# Patient Record
Sex: Female | Born: 1935 | Race: White | Hispanic: No | Smoking: Current every day smoker
Health system: Southern US, Community
[De-identification: ages and names within clinical notes are randomized; demographics above are authoritative.]

## PROBLEM LIST (undated history)

## (undated) DIAGNOSIS — I2510 Atherosclerotic heart disease of native coronary artery without angina pectoris: Secondary | ICD-10-CM

## (undated) DIAGNOSIS — Z923 Personal history of irradiation: Secondary | ICD-10-CM

## (undated) DIAGNOSIS — K219 Gastro-esophageal reflux disease without esophagitis: Secondary | ICD-10-CM

## (undated) DIAGNOSIS — I10 Essential (primary) hypertension: Secondary | ICD-10-CM

## (undated) DIAGNOSIS — Z9221 Personal history of antineoplastic chemotherapy: Secondary | ICD-10-CM

## (undated) DIAGNOSIS — G4730 Sleep apnea, unspecified: Secondary | ICD-10-CM

## (undated) DIAGNOSIS — C039 Malignant neoplasm of gum, unspecified: Secondary | ICD-10-CM

## (undated) DIAGNOSIS — E079 Disorder of thyroid, unspecified: Secondary | ICD-10-CM

## (undated) DIAGNOSIS — C3490 Malignant neoplasm of unspecified part of unspecified bronchus or lung: Secondary | ICD-10-CM

## (undated) DIAGNOSIS — J449 Chronic obstructive pulmonary disease, unspecified: Secondary | ICD-10-CM

## (undated) DIAGNOSIS — E785 Hyperlipidemia, unspecified: Secondary | ICD-10-CM

## (undated) DIAGNOSIS — R911 Solitary pulmonary nodule: Secondary | ICD-10-CM

## (undated) DIAGNOSIS — C411 Malignant neoplasm of mandible: Secondary | ICD-10-CM

## (undated) HISTORY — DX: Personal history of irradiation: Z92.3

## (undated) HISTORY — DX: Malignant neoplasm of unspecified part of unspecified bronchus or lung: C34.90

## (undated) HISTORY — DX: Atherosclerotic heart disease of native coronary artery without angina pectoris: I25.10

## (undated) HISTORY — DX: Gastro-esophageal reflux disease without esophagitis: K21.9

## (undated) HISTORY — DX: Personal history of antineoplastic chemotherapy: Z92.21

## (undated) HISTORY — PX: ABDOMINAL HYSTERECTOMY: SHX81

## (undated) HISTORY — DX: Malignant neoplasm of mandible: C41.1

## (undated) HISTORY — DX: Essential (primary) hypertension: I10

## (undated) HISTORY — DX: Disorder of thyroid, unspecified: E07.9

## (undated) HISTORY — PX: VAGINAL HYSTERECTOMY: SHX2639

## (undated) HISTORY — PX: LUNG BIOPSY: SHX232

## (undated) HISTORY — DX: Hyperlipidemia, unspecified: E78.5

## (undated) HISTORY — PX: LUNG SURGERY: SHX703

## (undated) HISTORY — DX: Solitary pulmonary nodule: R91.1

## (undated) HISTORY — PX: OTHER SURGICAL HISTORY: SHX169

## (undated) HISTORY — DX: Sleep apnea, unspecified: G47.30

## (undated) HISTORY — DX: Malignant neoplasm of gum, unspecified: C03.9

---

## 1993-08-24 DIAGNOSIS — C3490 Malignant neoplasm of unspecified part of unspecified bronchus or lung: Secondary | ICD-10-CM

## 1993-08-24 DIAGNOSIS — Z9221 Personal history of antineoplastic chemotherapy: Secondary | ICD-10-CM

## 1993-08-24 DIAGNOSIS — Z923 Personal history of irradiation: Secondary | ICD-10-CM

## 1993-08-24 HISTORY — DX: Malignant neoplasm of unspecified part of unspecified bronchus or lung: C34.90

## 1993-08-24 HISTORY — DX: Personal history of irradiation: Z92.3

## 1993-08-24 HISTORY — DX: Personal history of antineoplastic chemotherapy: Z92.21

## 2011-08-10 ENCOUNTER — Other Ambulatory Visit: Payer: Self-pay | Admitting: Family Medicine

## 2011-08-10 DIAGNOSIS — R221 Localized swelling, mass and lump, neck: Secondary | ICD-10-CM

## 2011-08-12 ENCOUNTER — Ambulatory Visit
Admission: RE | Admit: 2011-08-12 | Discharge: 2011-08-12 | Disposition: A | Discharge status: Home / Self Care | Payer: Medicare Other | Source: Ambulatory Visit | Attending: Family Medicine | Admitting: Family Medicine

## 2011-08-12 DIAGNOSIS — R221 Localized swelling, mass and lump, neck: Secondary | ICD-10-CM

## 2011-08-12 MED ORDER — IOHEXOL 300 MG/ML  SOLN
75.0000 mL | Freq: Once | INTRAMUSCULAR | Status: AC | PRN
  Administered 2011-08-12: 75 mL via INTRAVENOUS

## 2011-08-13 ENCOUNTER — Other Ambulatory Visit: Payer: Self-pay | Admitting: Family Medicine

## 2011-08-13 DIAGNOSIS — R911 Solitary pulmonary nodule: Secondary | ICD-10-CM

## 2011-08-20 ENCOUNTER — Encounter: Payer: Self-pay | Admitting: Internal Medicine

## 2011-08-20 ENCOUNTER — Ambulatory Visit (INDEPENDENT_AMBULATORY_CARE_PROVIDER_SITE_OTHER): Payer: Medicare Other | Admitting: Internal Medicine

## 2011-08-20 VITALS — BP 160/72 | HR 105 | Wt 188.8 lb

## 2011-08-20 DIAGNOSIS — R0600 Dyspnea, unspecified: Secondary | ICD-10-CM

## 2011-08-20 DIAGNOSIS — E785 Hyperlipidemia, unspecified: Secondary | ICD-10-CM

## 2011-08-20 DIAGNOSIS — I10 Essential (primary) hypertension: Secondary | ICD-10-CM

## 2011-08-20 DIAGNOSIS — F17200 Nicotine dependence, unspecified, uncomplicated: Secondary | ICD-10-CM

## 2011-08-20 DIAGNOSIS — Z720 Tobacco use: Secondary | ICD-10-CM

## 2011-08-20 DIAGNOSIS — I2510 Atherosclerotic heart disease of native coronary artery without angina pectoris: Secondary | ICD-10-CM

## 2011-08-20 DIAGNOSIS — R0602 Shortness of breath: Secondary | ICD-10-CM

## 2011-08-20 MED ORDER — DILTIAZEM HCL ER COATED BEADS 120 MG PO CP24: 120.0000 mg | ORAL_CAPSULE | Freq: Two times a day (BID) | ORAL | Status: DC

## 2011-08-20 NOTE — Patient Instructions (Addendum)
Lab work today. We will call you with results.  STOP Lisinopril  START Cardizem CD 120mg  2 times per day.  Your physician has requested that you have an echocardiogram. Echocardiography is a painless test that uses sound waves to create images of your heart. It provides your doctor with information about the size and shape of your heart and how well your heart's chambers and valves are working. This procedure takes approximately one hour. There are no restrictions for this procedure.  We will call you with your echo results.

## 2011-08-20 NOTE — Progress Notes (Signed)
HPI Patient is a 77 year old who is referred for Patient had a cardiac cath done in 2011 ([facility])  Had mild CAD.  Normal LVEF  1+ MR.   In past year breathig has gotten worse. Can't do anything.  At rest OK.    Hurting between shoulder blades.  Pain with coughing, vacuuming On 4th of December told had pneumonia L lung.  Rx Avelox 400 1x per day.  Has not returned.   Phlegm is white.   Had CT of neck Had mass at top of lung. BP checks from home earlier this month (140s to 160s systolic)  Allergies no known allergies  Current Outpatient Prescriptions  Medication Sig Dispense Refill  . aspirin 81 MG tablet Take 81 mg by mouth daily.        . cetirizine (ZYRTEC) 10 MG tablet Take 10 mg by mouth daily.        Marland Kitchen co-enzyme Q-10 30 MG capsule Take 100 mg by mouth daily.        Marland Kitchen levothyroxine (SYNTHROID, LEVOTHROID) 100 MCG tablet Take 100 mcg by mouth daily.        Marland Kitchen lisinopril (PRINIVIL,ZESTRIL) 10 MG tablet Take 10 mg by mouth daily.        Marland Kitchen LORazepam (ATIVAN) 1 MG tablet Take 1 mg by mouth every 8 (eight) hours.        . Melatonin 1 MG CAPS Take by mouth as needed.        . NON FORMULARY Take 10 mg by mouth daily as needed. bc powder       . omeprazole (PRILOSEC) 40 MG capsule Take 40 mg by mouth daily.        . rosuvastatin (CRESTOR) 40 MG tablet Take 40 mg by mouth daily.        . vitamin B-12 (CYANOCOBALAMIN) 1000 MCG tablet Take 1,000 mcg by mouth daily.          No past medical history on file.  No past surgical history on file.  No family history on file.  History   Social History  . Marital Status: Widowed    Spouse Name: N/A    Number of Children: N/A  . Years of Education: N/A   Occupational History  . Not on file.   Social History Main Topics  . Smoking status: Current Everyday Smoker  . Smokeless tobacco: Not on file  . Alcohol Use: Not on file  . Drug Use: Not on file  . Sexually Active: Not on file   Other Topics Concern  . Not on file   Social History  Narrative  . No narrative on file    Review of Systems:  All systems reviewed.  They are negative to the above problem except as previously stated.  Vital Signs: BP 160/72  Pulse 105  Wt 188 lb 12.8 oz (85.639 kg)  PF 5 L/min  Physical Exam  Patient is in NAD  Sats stay at 96% with walking   HEENT:  Normocephalic, atraumatic. EOMI, PERRLA.  Neck: JVP is normal. . No bruits.  Lungs: Decreased air flow.  No wheezes or rales.  Heart: Regular rate and rhythm. Normal S1, S2. No S3.   No significant murmurs. PMI not displaced.  Abdomen:  Supple, nontender. Normal bowel sounds. No masses. No hepatomegaly.  Extremities:   Good distal pulses throughout. No lower extremity edema.  Musculoskeletal :moving all extremities.  Neuro:   alert and oriented x3.  CN II-XII grossly intact.  Assessment and Plan:

## 2011-08-21 ENCOUNTER — Ambulatory Visit
Admission: RE | Admit: 2011-08-21 | Discharge: 2011-08-21 | Disposition: A | Discharge status: Home / Self Care | Payer: Medicare Other | Source: Ambulatory Visit | Attending: Family Medicine | Admitting: Family Medicine

## 2011-08-21 ENCOUNTER — Telehealth: Payer: Self-pay | Admitting: Internal Medicine

## 2011-08-21 DIAGNOSIS — R911 Solitary pulmonary nodule: Secondary | ICD-10-CM

## 2011-08-21 LAB — CBC WITH DIFFERENTIAL/PLATELET
Basophils Absolute: 0.1 10*3/uL (ref 0.0–0.1)
Basophils Relative: 0.7 % (ref 0.0–3.0)
Eosinophils Absolute: 0.5 10*3/uL (ref 0.0–0.7)
Hemoglobin: 12.7 g/dL (ref 12.0–15.0)
Lymphocytes Relative: 26.9 % (ref 12.0–46.0)
MCHC: 33.3 g/dL (ref 30.0–36.0)
Monocytes Relative: 7 % (ref 3.0–12.0)
Neutrophils Relative %: 59 % (ref 43.0–77.0)
RBC: 4.13 Mil/uL (ref 3.87–5.11)

## 2011-08-21 LAB — BASIC METABOLIC PANEL
Chloride: 101 mEq/L (ref 96–112)
GFR: 103.49 mL/min (ref >60.00)
Glucose, Bld: 94 mg/dL (ref 70–99)
Potassium: 4.5 mEq/L (ref 3.5–5.1)
Sodium: 140 mEq/L (ref 135–145)

## 2011-08-21 MED ORDER — IOHEXOL 300 MG/ML  SOLN
75.0000 mL | Freq: Once | INTRAMUSCULAR | Status: AC | PRN
  Administered 2011-08-21: 75 mL via INTRAVENOUS

## 2011-08-21 NOTE — Telephone Encounter (Signed)
Pt Signed Release To obtain records from [facility], Records Received gave to [facility] 08/21/11/KM.Marland Kitchen

## 2011-08-23 ENCOUNTER — Encounter: Payer: Self-pay | Admitting: Internal Medicine

## 2011-08-23 DIAGNOSIS — I10 Essential (primary) hypertension: Secondary | ICD-10-CM | POA: Insufficient documentation

## 2011-08-23 DIAGNOSIS — E785 Hyperlipidemia, unspecified: Secondary | ICD-10-CM | POA: Insufficient documentation

## 2011-08-23 DIAGNOSIS — I2510 Atherosclerotic heart disease of native coronary artery without angina pectoris: Secondary | ICD-10-CM | POA: Insufficient documentation

## 2011-08-23 DIAGNOSIS — Z720 Tobacco use: Secondary | ICD-10-CM | POA: Insufficient documentation

## 2011-08-23 DIAGNOSIS — J439 Emphysema, unspecified: Secondary | ICD-10-CM | POA: Insufficient documentation

## 2011-08-23 NOTE — Assessment & Plan Note (Signed)
On statin  Wil need to be followed.

## 2011-08-23 NOTE — Assessment & Plan Note (Signed)
BP is not optimally controlled.  I will not make any changes for now.  Will check labs and echo first

## 2011-08-23 NOTE — Assessment & Plan Note (Signed)
Counsellled on cessation (patch, chantix)

## 2011-08-23 NOTE — Assessment & Plan Note (Signed)
Patient with signif COPD on exam.  Still smokes  Being evaluated for lung lesion.  Hx lung Ca.  She continues to smoke. ON exam volume status does not look bad I would check labs and also a 2D echo.  I am not convinced of active angina

## 2011-08-23 NOTE — Assessment & Plan Note (Signed)
Mild by cath.  Will check echo.

## 2011-08-28 ENCOUNTER — Ambulatory Visit (HOSPITAL_COMMUNITY): Payer: Medicare Other | Attending: Cardiology | Admitting: Radiology

## 2011-08-28 DIAGNOSIS — R0609 Other forms of dyspnea: Secondary | ICD-10-CM | POA: Insufficient documentation

## 2011-08-28 DIAGNOSIS — I10 Essential (primary) hypertension: Secondary | ICD-10-CM | POA: Insufficient documentation

## 2011-08-28 DIAGNOSIS — J449 Chronic obstructive pulmonary disease, unspecified: Secondary | ICD-10-CM | POA: Insufficient documentation

## 2011-08-28 DIAGNOSIS — E785 Hyperlipidemia, unspecified: Secondary | ICD-10-CM | POA: Insufficient documentation

## 2011-08-28 DIAGNOSIS — R0602 Shortness of breath: Secondary | ICD-10-CM

## 2011-08-28 DIAGNOSIS — R0989 Other specified symptoms and signs involving the circulatory and respiratory systems: Secondary | ICD-10-CM | POA: Insufficient documentation

## 2011-08-28 DIAGNOSIS — J4489 Other specified chronic obstructive pulmonary disease: Secondary | ICD-10-CM | POA: Insufficient documentation

## 2011-08-31 ENCOUNTER — Telehealth: Payer: Self-pay | Admitting: *Deleted

## 2011-08-31 NOTE — Telephone Encounter (Signed)
Called patient with lab results and echo results. She will follow up with Dr.Fred Andrey Campanile. Reports sent to him.

## 2011-09-10 DIAGNOSIS — R911 Solitary pulmonary nodule: Secondary | ICD-10-CM

## 2011-09-11 ENCOUNTER — Encounter: Payer: Self-pay | Admitting: *Deleted

## 2011-09-11 NOTE — Progress Notes (Signed)
Spoke with [facility] office regarding pt appt with [facility] 09/15/11

## 2011-09-11 NOTE — Progress Notes (Signed)
Addended by: Judithe Modest D on: 09/11/2011 03:24 PM   Modules accepted: Orders

## 2011-09-14 DIAGNOSIS — R911 Solitary pulmonary nodule: Secondary | ICD-10-CM | POA: Insufficient documentation

## 2011-09-15 ENCOUNTER — Encounter: Payer: Medicare Other | Admitting: Cardiothoracic Surgery

## 2011-09-18 ENCOUNTER — Encounter: Payer: Medicare Other | Admitting: Cardiothoracic Surgery

## 2011-09-22 DIAGNOSIS — C801 Malignant (primary) neoplasm, unspecified: Secondary | ICD-10-CM | POA: Insufficient documentation

## 2011-09-23 ENCOUNTER — Encounter: Payer: Self-pay | Admitting: Cardiothoracic Surgery

## 2011-09-23 ENCOUNTER — Institutional Professional Consult (permissible substitution) (INDEPENDENT_AMBULATORY_CARE_PROVIDER_SITE_OTHER): Payer: Medicare Other | Admitting: Cardiothoracic Surgery

## 2011-09-23 ENCOUNTER — Other Ambulatory Visit: Payer: Self-pay | Admitting: Cardiothoracic Surgery

## 2011-09-23 ENCOUNTER — Encounter: Payer: Self-pay | Admitting: *Deleted

## 2011-09-23 VITALS — BP 140/75 | HR 100 | Resp 20 | Ht 69.0 in | Wt 190.0 lb

## 2011-09-23 DIAGNOSIS — G4733 Obstructive sleep apnea (adult) (pediatric): Secondary | ICD-10-CM | POA: Insufficient documentation

## 2011-09-23 DIAGNOSIS — D381 Neoplasm of uncertain behavior of trachea, bronchus and lung: Secondary | ICD-10-CM

## 2011-09-23 DIAGNOSIS — R911 Solitary pulmonary nodule: Secondary | ICD-10-CM

## 2011-09-23 DIAGNOSIS — J984 Other disorders of lung: Secondary | ICD-10-CM
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
## 2011-09-23 NOTE — Patient Instructions (Signed)
Stop smoking

## 2011-09-29 ENCOUNTER — Encounter (HOSPITAL_COMMUNITY)
Admission: RE | Admit: 2011-09-29 | Discharge: 2011-09-29 | Disposition: A | Discharge status: Home / Self Care | Payer: Medicare Other | Source: Ambulatory Visit | Attending: Cardiothoracic Surgery | Admitting: Cardiothoracic Surgery

## 2011-09-29 DIAGNOSIS — D381 Neoplasm of uncertain behavior of trachea, bronchus and lung: Secondary | ICD-10-CM

## 2011-09-29 DIAGNOSIS — R928 Other abnormal and inconclusive findings on diagnostic imaging of breast: Secondary | ICD-10-CM | POA: Insufficient documentation

## 2011-09-29 DIAGNOSIS — Z90710 Acquired absence of both cervix and uterus: Secondary | ICD-10-CM | POA: Insufficient documentation

## 2011-09-29 DIAGNOSIS — M899 Disorder of bone, unspecified: Secondary | ICD-10-CM | POA: Insufficient documentation

## 2011-09-29 DIAGNOSIS — R222 Localized swelling, mass and lump, trunk: Secondary | ICD-10-CM | POA: Insufficient documentation

## 2011-09-29 DIAGNOSIS — Z902 Acquired absence of lung [part of]: Secondary | ICD-10-CM | POA: Insufficient documentation

## 2011-09-29 DIAGNOSIS — I700 Atherosclerosis of aorta: Secondary | ICD-10-CM | POA: Insufficient documentation

## 2011-09-29 LAB — GLUCOSE, CAPILLARY: Glucose-Capillary: 135 mg/dL — ABNORMAL HIGH (ref 70–99)

## 2011-09-29 MED ORDER — FLUDEOXYGLUCOSE F - 18 (FDG) INJECTION
16.9000 | Freq: Once | INTRAVENOUS | Status: AC | PRN
  Administered 2011-09-29: 16.9 via INTRAVENOUS

## 2011-09-30 ENCOUNTER — Ambulatory Visit (INDEPENDENT_AMBULATORY_CARE_PROVIDER_SITE_OTHER): Payer: Medicare Other | Admitting: Cardiothoracic Surgery

## 2011-09-30 ENCOUNTER — Encounter: Payer: Self-pay | Admitting: Cardiothoracic Surgery

## 2011-09-30 VITALS — BP 130/71 | HR 104 | Resp 20 | Ht 70.0 in | Wt 190.0 lb

## 2011-09-30 DIAGNOSIS — J449 Chronic obstructive pulmonary disease, unspecified: Secondary | ICD-10-CM

## 2011-09-30 DIAGNOSIS — Z902 Acquired absence of lung [part of]: Secondary | ICD-10-CM

## 2011-09-30 DIAGNOSIS — R911 Solitary pulmonary nodule: Secondary | ICD-10-CM

## 2011-09-30 DIAGNOSIS — Z9889 Other specified postprocedural states: Secondary | ICD-10-CM

## 2011-09-30 NOTE — Patient Instructions (Signed)
Stop smoking

## 2011-09-30 NOTE — Progress Notes (Signed)
Patient ID: Alexandria Warren, female   DOB: [birth year], 78 y.o.   MRN: [id number] Spirometry in th e office indicates FEV1` 1.0 and FVC 1.9

## 2011-09-30 NOTE — Progress Notes (Signed)
HPI:                     [address]            [place] [address]          [phone]    1.2 cm nodule severe segment left lower lobe with negative activity and PET scan Status post right upper lobectomy for stage III non-small cell carcinoma 1992 m to [facility] COPD, severe with active smoking  The patient returns to discuss results of a recent PET scan to assess the hypermetabolic potential of a newly diagnosed nodule in the severe segment left lower lobe. The patient recently resolved a left lung bronchopneumonia after plum course of antibiotics. The nodule in question is somewhat smooth in contour. The patient has old significant scarring in the right lung apex following surgery and radiation therapy in 1992. The PET scan shows no enhanced activity of the nodule. There is some mild activity in the right mediastinal nodal tissue from radiation changes. There is some mild activity in the eschar of the right upper lobe area none of which suggest high suspicion for recurrent cancer. While the patient does have risk of recurrent cancer due to the continued smoking I would not recommend mediastinoscopy or transbronchial biopsy under general anesthesia the to her severe COPD and potential for significant overtop the patient's. I would recommend rather a followup CT scan in 3-4 months to assess the left lung nodule.    Current Outpatient Prescriptions  Medication Sig Dispense Refill  . aspirin 81 MG tablet Take 81 mg by mouth daily.        . cetirizine (ZYRTEC) 10 MG tablet Take 10 mg by mouth daily.        Marland Kitchen co-enzyme Q-10 30 MG capsule Take 100 mg by mouth daily.        Marland Kitchen diltiazem (CARDIZEM CD) 120 MG 24 hr capsule Take 1 capsule (120 mg total) by mouth 2 (two) times daily.  60 capsule  6  . levothyroxine (SYNTHROID, LEVOTHROID) 100 MCG tablet Take 100 mcg by mouth daily.        Marland Kitchen LORazepam (ATIVAN) 1 MG tablet Take 1 mg by mouth every 8 (eight) hours.        .  Melatonin 1 MG CAPS Take by mouth as needed.        . NON FORMULARY Take 10 mg by mouth daily as needed. bc powder       . omeprazole (PRILOSEC) 40 MG capsule Take 40 mg by mouth daily.        . rosuvastatin (CRESTOR) 40 MG tablet Take 40 mg by mouth daily.        . vitamin B-12 (CYANOCOBALAMIN) 1000 MCG tablet Take 1,000 mcg by mouth daily.           Physical Exam: Vital signs blood pressure 140/70 pulse 100 saturation room air 95% Breath sounds distant bilaterally No palpable adenopathy the neck No edema  Diagnostic Tests: PET scan results reviewed with the patient and her husband. The newly diagnosed left lower lung nodule has no significant hypermetabolic activity. The previous area of her surgery and radiation and the right apex and right mediastinal have some residual low-level activity reactive to the previous surgery and radiation.  Impression: No evidence of new or recurrent lung cancer. We'll plan on a followup CT scan in 3-4 months. He is strong recommend patient stopped smoking.  Plan: Return in  May 2013 with a CT scan of the chest

## 2011-10-01 ENCOUNTER — Other Ambulatory Visit: Payer: Self-pay | Admitting: *Deleted

## 2011-10-01 DIAGNOSIS — J449 Chronic obstructive pulmonary disease, unspecified: Secondary | ICD-10-CM

## 2011-10-01 MED ORDER — IPRATROPIUM-ALBUTEROL 18-103 MCG/ACT IN AERO: 2.0000 | INHALATION_SPRAY | Freq: Four times a day (QID) | RESPIRATORY_TRACT | Status: DC | PRN

## 2011-10-02 ENCOUNTER — Institutional Professional Consult (permissible substitution): Payer: Medicare Other | Admitting: Internal Medicine

## 2011-12-03 ENCOUNTER — Other Ambulatory Visit: Payer: Self-pay | Admitting: Cardiothoracic Surgery

## 2011-12-03 DIAGNOSIS — D381 Neoplasm of uncertain behavior of trachea, bronchus and lung: Secondary | ICD-10-CM

## 2012-01-27 ENCOUNTER — Encounter: Payer: Medicare Other | Admitting: Cardiothoracic Surgery

## 2012-01-27 ENCOUNTER — Other Ambulatory Visit: Payer: Medicare Other

## 2012-01-29 ENCOUNTER — Ambulatory Visit
Admission: RE | Admit: 2012-01-29 | Discharge: 2012-01-29 | Disposition: A | Discharge status: Home / Self Care | Payer: Medicare Other | Source: Ambulatory Visit | Attending: Cardiothoracic Surgery | Admitting: Cardiothoracic Surgery

## 2012-01-29 ENCOUNTER — Ambulatory Visit (INDEPENDENT_AMBULATORY_CARE_PROVIDER_SITE_OTHER): Payer: Medicare Other | Admitting: Cardiothoracic Surgery

## 2012-01-29 ENCOUNTER — Encounter: Payer: Self-pay | Admitting: Cardiothoracic Surgery

## 2012-01-29 VITALS — BP 141/82 | HR 100 | Resp 20 | Ht 70.0 in | Wt 198.0 lb

## 2012-01-29 DIAGNOSIS — D381 Neoplasm of uncertain behavior of trachea, bronchus and lung: Secondary | ICD-10-CM

## 2012-01-29 DIAGNOSIS — R911 Solitary pulmonary nodule: Secondary | ICD-10-CM

## 2012-01-29 DIAGNOSIS — J449 Chronic obstructive pulmonary disease, unspecified: Secondary | ICD-10-CM

## 2012-01-29 DIAGNOSIS — Z85118 Personal history of other malignant neoplasm of bronchus and lung: Secondary | ICD-10-CM

## 2012-01-29 DIAGNOSIS — J4489 Other specified chronic obstructive pulmonary disease: Secondary | ICD-10-CM

## 2012-01-29 MED ORDER — IOHEXOL 300 MG/ML  SOLN
75.0000 mL | Freq: Once | INTRAMUSCULAR | Status: AC | PRN
  Administered 2012-01-29: 75 mL via INTRAVENOUS

## 2012-01-29 NOTE — Progress Notes (Signed)
PCP is [name], MD, MD Referring Provider is Richmond Campbell., PA  Chief Complaint  Patient presents with  . Lung Lesion    3 month f/u with Chest CT, surveillance of Lt lung nodule                      [address]            [name] [address]          [phone]      HPI: 78 year old Caucasian female smoker one half to one pack per day returns for 3 month followup CT scan of a recently diagnosed 8 mm superior segment left lower lobe nodule negative for metabolic activity on PET scan. Past history of right upper lobectomy followed by chemoradiation at [facility] 20 years ago. Some right paratracheal adenopathy with low level activity on PET scan consistent with previous radiation. CT scan today shows no change in the 8 mm left lower lobe nodule, no change in the right paratracheal adenopathy or the postoperative changes in the right lung. Recommend followup CT scan of the chest in 6 months and stopped smoking cessation. Past Medical History  Diagnosis Date  . Thyroid disease   . Hypertension   . Coronary artery disease   . Hyperlipidemia   . Lung nodule   . Cancer     lung  . Sleep apnea   . GERD (gastroesophageal reflux disease)   . Hyperglycemia   . Jaw sarcoma     Past Surgical History  Procedure Date  . Vaginal hysterectomy   . Lung surgery     Family History  Problem Relation Age of Onset  . Hypertension    . Cancer    . Allergic rhinitis    . Heart disease      Social History History  Substance Use Topics  . Smoking status: Current Everyday Smoker  . Smokeless tobacco: Not on file  . Alcohol Use: Not on file    Current Outpatient Prescriptions  Medication Sig Dispense Refill  . albuterol (PROVENTIL HFA;VENTOLIN HFA) 108 (90 BASE) MCG/ACT inhaler Inhale 2 puffs into the lungs every 6 (six) hours as needed.      Marland Kitchen albuterol-ipratropium (COMBIVENT) 18-103 MCG/ACT inhaler Inhale 2 puffs into the lungs 4 (four) times  daily as needed for wheezing.  1 Inhaler  2  . aspirin 81 MG tablet Take 81 mg by mouth daily.        . budesonide-formoterol (SYMBICORT) 160-4.5 MCG/ACT inhaler Inhale 2 puffs into the lungs 2 (two) times daily.      . cetirizine (ZYRTEC) 10 MG tablet Take 10 mg by mouth daily.        Marland Kitchen co-enzyme Q-10 30 MG capsule Take 100 mg by mouth daily.        Marland Kitchen levothyroxine (SYNTHROID, LEVOTHROID) 100 MCG tablet Take 100 mcg by mouth daily.        Marland Kitchen LORazepam (ATIVAN) 1 MG tablet Take 1 mg by mouth every 8 (eight) hours.        . Melatonin 1 MG CAPS Take by mouth as needed.        . NON FORMULARY Take 10 mg by mouth daily as needed. bc powder       . omeprazole (PRILOSEC) 40 MG capsule Take 40 mg by mouth daily.        . rosuvastatin (CRESTOR) 40 MG tablet Take 40 mg by mouth daily.        Marland Kitchen  vitamin B-12 (CYANOCOBALAMIN) 1000 MCG tablet Take 1,000 mcg by mouth daily.         No current facility-administered medications for this visit.   Facility-Administered Medications Ordered in Other Visits  Medication Dose Route Frequency Provider Last Rate Last Dose  . iohexol (OMNIPAQUE) 300 MG/ML solution 75 mL  75 mL Intravenous Once PRN Medication Radiologist, MD   75 mL at 01/29/12 0928    Allergies  Allergen Reactions  . Penicillins     Review of Systems patient denies weight loss bone pain change in neurologic status. Patient complains of bilateral ankle swelling treated with Lasix by her primary physician Dr. Arlyce Dice. Patient states her wheezing is much better on Symbicort. Patient denies any recent episodes of productive cough or upper respiratory infection.  BP 141/82  Pulse 100  Resp 20  Ht 5\' 10"  (1.778 m)  Wt 198 lb (89.812 kg)  BMI 28.41 kg/m2  SpO2 90% Physical Exam Alert and pleasant but dyspneic with normal conversation HEENT normocephalic Neck without palpable adenopathy Breath sounds somewhat diminished on the right clear on the left Cardiac rhythm regular 1+ ankle  edema  Diagnostic Tests: The scan of the chest is reviewed. The 8 mm nodule the superior segment left lower lobe has shown no changes since previous study February 2013.  Impression: Non PET avid nodule left lower lobe stable the past 3 months. Followup CT scan planned in 6 months.  Plan: Smoking cessation recommended the patient. Nodule remained small and without activity on PET scan I would not recommend a biopsy or excision due to the patient's very poor pulmonary reserve. We'll review with CT scan in 6 months

## 2012-01-29 NOTE — Patient Instructions (Signed)
Stop smoking

## 2012-03-15 ENCOUNTER — Other Ambulatory Visit: Payer: Self-pay

## 2012-03-15 MED ORDER — DILTIAZEM HCL ER COATED BEADS 120 MG PO CP24: 120.0000 mg | ORAL_CAPSULE | Freq: Every day | ORAL | Status: DC

## 2012-06-17 ENCOUNTER — Encounter: Payer: Self-pay | Admitting: Cardiology

## 2012-06-17 ENCOUNTER — Ambulatory Visit (INDEPENDENT_AMBULATORY_CARE_PROVIDER_SITE_OTHER): Payer: Medicare Other | Admitting: Cardiology

## 2012-06-17 VITALS — BP 135/71 | HR 87 | Ht 70.0 in | Wt 193.4 lb

## 2012-06-17 DIAGNOSIS — Z720 Tobacco use: Secondary | ICD-10-CM

## 2012-06-17 DIAGNOSIS — I2510 Atherosclerotic heart disease of native coronary artery without angina pectoris: Secondary | ICD-10-CM

## 2012-06-17 DIAGNOSIS — R0600 Dyspnea, unspecified: Secondary | ICD-10-CM

## 2012-06-17 DIAGNOSIS — F17200 Nicotine dependence, unspecified, uncomplicated: Secondary | ICD-10-CM

## 2012-06-17 DIAGNOSIS — I10 Essential (primary) hypertension: Secondary | ICD-10-CM

## 2012-06-17 DIAGNOSIS — E785 Hyperlipidemia, unspecified: Secondary | ICD-10-CM

## 2012-06-17 NOTE — Progress Notes (Signed)
HPI The patient presents for evaluation of chest discomfort. She has been seen before and I do note the results of the catheterization in 2011 that demonstrated 20% LAD stenosis and 30% right coronary stenosis with a well preserved ejection fraction. She had dyspnea and evaluated earlier this year with an echocardiogram that demonstrated an EF of 55% with no significant valvular abnormalities as lipomatous hypertrophy.  She is referred back for an episode of chest discomfort. This happened about one week ago while she was seated. She was resting comfortably and developed substernal heaviness that she had never had before. It was moderate to severe in intensity. It lasted at least 3 minutes. She was diaphoretic. It came and went spontaneously. There was no radiation to her jaw or to her arms. She is chronically dyspneic and didn't have any acute exacerbation. She chronically is with her head reclined in a hospital bed but does not describe any new PND or orthopnea. Her heart goes fast with any activities but she's not had any presyncope or syncope. She has not had these symptoms before or since.  Allergies  Allergen Reactions  . Penicillins     Current Outpatient Prescriptions  Medication Sig Dispense Refill  . aspirin 81 MG tablet Take 81 mg by mouth daily.        Marland Kitchen atenolol (TENORMIN) 25 MG tablet       . budesonide-formoterol (SYMBICORT) 160-4.5 MCG/ACT inhaler Inhale 2 puffs into the lungs 2 (two) times daily.      . cetirizine (ZYRTEC) 10 MG tablet Take 10 mg by mouth daily. As needed      . co-enzyme Q-10 30 MG capsule Take 100 mg by mouth daily.        . furosemide (LASIX) 20 MG tablet       . levothyroxine (SYNTHROID, LEVOTHROID) 100 MCG tablet Take 100 mcg by mouth daily.        Marland Kitchen lisinopril (PRINIVIL,ZESTRIL) 10 MG tablet       . LORazepam (ATIVAN) 1 MG tablet Take 1 mg by mouth every 8 (eight) hours.        . Melatonin 1 MG CAPS Take by mouth as needed.        . Multiple Vitamin  (MULTIVITAMIN) tablet Take 1 tablet by mouth daily.      Marland Kitchen omeprazole (PRILOSEC) 40 MG capsule Take 40 mg by mouth daily.        . rosuvastatin (CRESTOR) 40 MG tablet Take 40 mg by mouth daily.        . NON FORMULARY Take 10 mg by mouth daily as needed. bc powder       . vitamin B-12 (CYANOCOBALAMIN) 1000 MCG tablet Take 1,000 mcg by mouth daily.          Past Medical History  Diagnosis Date  . Thyroid disease   . Hypertension   . Coronary artery disease   . Hyperlipidemia   . Lung nodule   . Cancer     lung  . Sleep apnea   . GERD (gastroesophageal reflux disease)   . Hyperglycemia   . Jaw sarcoma     Past Surgical History  Procedure Date  . Vaginal hysterectomy   . Lung surgery     ROS:  Positive for dizziness, reflux, neck pain, joint pains, seasonal allergies.Otherwise as stated in the HPI and negative for all other systems.  PHYSICAL EXAM BP 135/71  Pulse 87  Ht 5\' 10"  (1.778 m)  Wt 193 lb 6.4  oz (87.726 kg)  BMI 27.75 kg/m2 GENERAL:  Well appearing HEENT:  Pupils equal round and reactive, fundi not visualized, oral mucosa unremarkable, dentures NECK:  No jugular venous distention, waveform within normal limits, carotid upstroke brisk and symmetric, right carotid bruits, no thyromegaly LYMPHATICS:  No cervical, inguinal adenopathy LUNGS:  Clear to auscultation bilaterally,, decreased lung sounds BACK:  No CVA tenderness CHEST:  Unremarkable HEART:  PMI not displaced or sustained,S1 and S2 within normal limits, no S3, no S4, no clicks, no rubs, no murmurs ABD:  Flat, positive bowel sounds normal in frequency in pitch, no bruits, no rebound, no guarding, no midline pulsatile mass, no hepatomegaly, no splenomegaly EXT:  2 plus pulses throughout, no edema, no cyanosis no clubbing SKIN:  No rashes no nodules NEURO:  Cranial nerves II through XII grossly intact, motor grossly intact throughout PSYCH:  Cognitively intact, oriented to person place and time   EKG:   Sinus rhythm, rate 87, RSR prime V1, first degree AV block, right atrial enlargement, no acute ST-T wave changes. 06/17/2012   ASSESSMENT AND PLAN  Chest pain - This has a typical and some atypical features. The possibility of progressive coronary disease is moderately high given her ongoing risk factors. She needs a screening stress test but would not be able to walk on a treadmill. Therefore, she will have a [employer].  Hypertension -  The blood pressure is at target. No change in medications is indicated. We will continue with therapeutic lifestyle changes (TLC).  Dyspnea -  I reviewed the echo she had an January and there were no significant abnormalities. I suspect this is predominantly pulmonary.  Tobacco abuse -  She is now actively trying to quit and I applauded this.   Carotid bruit - I will order a carotid Doppler

## 2012-06-17 NOTE — Patient Instructions (Addendum)
The current medical regimen is effective;  continue present plan and medications.  Your physician has requested that you have a lexiscan myoview. For further information please visit [email or web address] Please follow instruction sheet, as given.  Follow up with Dr Antoine Poche as needed.

## 2012-06-27 ENCOUNTER — Encounter (HOSPITAL_COMMUNITY): Payer: Medicare Other | Attending: Cardiology | Admitting: Radiology

## 2012-06-27 VITALS — BP 162/80 | Ht 70.0 in | Wt 189.0 lb

## 2012-06-27 DIAGNOSIS — R0989 Other specified symptoms and signs involving the circulatory and respiratory systems: Secondary | ICD-10-CM | POA: Insufficient documentation

## 2012-06-27 DIAGNOSIS — J449 Chronic obstructive pulmonary disease, unspecified: Secondary | ICD-10-CM | POA: Insufficient documentation

## 2012-06-27 DIAGNOSIS — E785 Hyperlipidemia, unspecified: Secondary | ICD-10-CM

## 2012-06-27 DIAGNOSIS — J4489 Other specified chronic obstructive pulmonary disease: Secondary | ICD-10-CM | POA: Insufficient documentation

## 2012-06-27 DIAGNOSIS — E7800 Pure hypercholesterolemia, unspecified: Secondary | ICD-10-CM | POA: Insufficient documentation

## 2012-06-27 DIAGNOSIS — R0602 Shortness of breath: Secondary | ICD-10-CM | POA: Insufficient documentation

## 2012-06-27 DIAGNOSIS — R0600 Dyspnea, unspecified: Secondary | ICD-10-CM

## 2012-06-27 DIAGNOSIS — F17200 Nicotine dependence, unspecified, uncomplicated: Secondary | ICD-10-CM | POA: Insufficient documentation

## 2012-06-27 DIAGNOSIS — Z85118 Personal history of other malignant neoplasm of bronchus and lung: Secondary | ICD-10-CM | POA: Insufficient documentation

## 2012-06-27 DIAGNOSIS — R079 Chest pain, unspecified: Secondary | ICD-10-CM | POA: Insufficient documentation

## 2012-06-27 DIAGNOSIS — R000 Tachycardia, unspecified: Secondary | ICD-10-CM | POA: Insufficient documentation

## 2012-06-27 DIAGNOSIS — I2510 Atherosclerotic heart disease of native coronary artery without angina pectoris: Secondary | ICD-10-CM

## 2012-06-27 DIAGNOSIS — R61 Generalized hyperhidrosis: Secondary | ICD-10-CM | POA: Insufficient documentation

## 2012-06-27 DIAGNOSIS — I10 Essential (primary) hypertension: Secondary | ICD-10-CM | POA: Insufficient documentation

## 2012-06-27 DIAGNOSIS — R0609 Other forms of dyspnea: Secondary | ICD-10-CM | POA: Insufficient documentation

## 2012-06-27 MED ORDER — REGADENOSON 0.4 MG/5ML IV SOLN
0.4000 mg | Freq: Once | INTRAVENOUS | Status: AC
  Administered 2012-06-27: 0.4 mg via INTRAVENOUS

## 2012-06-27 MED ORDER — TECHNETIUM TC 99M SESTAMIBI GENERIC - CARDIOLITE
11.0000 | Freq: Once | INTRAVENOUS | Status: AC | PRN
  Administered 2012-06-27: 11 via INTRAVENOUS

## 2012-06-27 MED ORDER — TECHNETIUM TC 99M SESTAMIBI GENERIC - CARDIOLITE
33.0000 | Freq: Once | INTRAVENOUS | Status: AC | PRN
  Administered 2012-06-27: 33 via INTRAVENOUS

## 2012-06-27 NOTE — Progress Notes (Signed)
[facility] SITE 3 NUCLEAR MED [address] [id number] [place] [address] [phone]  Cardiology Nuclear Med Study  Alexandria Warren is a 78 y.o. female     MRN : [id number]     DOB: Feb 17, 1934  Procedure Date: 06/27/2012  Nuclear Med Background Indication for Stress Test:  Evaluation for Ischemia History:  COPD and 2011 MPS: ok per pt 03/2010 Heart Cath: NL EF LAD 20% 30% RCA, 08/28/11 ECHO: EF: 55%  3/13 CT: LLL nodual, Lung Ca, CAD Cardiac Risk Factors: History of Smoking, Hypertension, Lipids and Smoker  Symptoms:  Chest Pain, Diaphoresis, DOE, Rapid HR and SOB   Nuclear Pre-Procedure Caffeine/Decaff Intake:  None > 12 hrs NPO After: 7:30pm   Lungs:  clear O2 Sat: 94-98% on room air. IV 0.9% NS with Angio Cath:  22g  IV Site: L Hand x 1, tolerated well IV Started by:  Irean Hong, RN  Chest Size (in):  38 Cup Size: C  Height: 5\' 10"  (1.778 m)  Weight:  189 lb (85.73 kg)  BMI:  Body mass index is 27.12 kg/(m^2). Tech Comments:  n/a    Nuclear Med Study 1 or 2 day study: 1 day  Stress Test Type:  Lexiscan  Reading MD: Olga Millers, MD  Order Authorizing Provider:  Rollene Rotunda, MD  Resting Radionuclide: Technetium 51m Sestamibi  Resting Radionuclide Dose: 11.0 mCi   Stress Radionuclide:  Technetium 66m Sestamibi  Stress Radionuclide Dose: 33.0 mCi           Stress Protocol Rest HR: 101 Stress HR: 113  Rest BP:162/80 Stress BP: 157/82  Exercise Time (min): n/a METS: n/a   Predicted Max HR: 144 bpm % Max HR: 77.78 bpm Rate Pressure Product: 10272   Dose of Adenosine (mg):  n/a Dose of Lexiscan: 0.4 mg  Dose of Atropine (mg): n/a Dose of Dobutamine: n/a mcg/kg/min (at max HR)  Stress Test Technologist: Milana Na, EMT-P  Nuclear Technologist:  Domenic Polite, CNMT     Rest Procedure:  Myocardial perfusion imaging was performed at rest 45 minutes following the intravenous administration of Technetium 15m Sestamibi. Rest ECG: Sinus  Tachycardia  Stress Procedure:  The patient received IV Lexiscan 0.4 mg over 15-seconds.  Technetium 1m Sestamibi injected at 30-seconds.  There were no significant changes, sob, lt. Headed, and rare pacs with [employer].  Quantitative spect images were obtained after a 45 minute delay. Stress ECG: No significant change from baseline ECG  QPS Raw Data Images:  Acquisition technically good; normal left ventricular size. Stress Images:  Normal homogeneous uptake in all areas of the myocardium. Rest Images:  Normal homogeneous uptake in all areas of the myocardium. Subtraction (SDS):  No evidence of ischemia. Transient Ischemic Dilatation (Normal <1.22):  1.19 Lung/Heart Ratio (Normal <0.45):  0.41  Quantitative Gated Spect Images QGS EDV:  91 ml QGS ESV:  43 ml  Impression Exercise Capacity:  Lexiscan with no exercise. BP Response:  Normal blood pressure response. Clinical Symptoms:  There is dyspnea. ECG Impression:  No significant ST segment change suggestive of ischemia. Comparison with Prior Nuclear Study: No images to compare  Overall Impression:  Normal stress nuclear study.  LV Ejection Fraction: 53%.  LV Wall Motion:  NL LV Function; NL Wall Motion  Olga Millers

## 2012-06-29 ENCOUNTER — Encounter: Payer: Self-pay | Admitting: Cardiology

## 2012-06-30 ENCOUNTER — Other Ambulatory Visit: Payer: Self-pay | Admitting: *Deleted

## 2012-06-30 DIAGNOSIS — R0989 Other specified symptoms and signs involving the circulatory and respiratory systems: Secondary | ICD-10-CM

## 2012-06-30 DIAGNOSIS — I6529 Occlusion and stenosis of unspecified carotid artery: Secondary | ICD-10-CM

## 2012-07-05 ENCOUNTER — Encounter (INDEPENDENT_AMBULATORY_CARE_PROVIDER_SITE_OTHER): Payer: Medicare Other

## 2012-07-05 DIAGNOSIS — R0989 Other specified symptoms and signs involving the circulatory and respiratory systems: Secondary | ICD-10-CM

## 2012-07-05 DIAGNOSIS — R42 Dizziness and giddiness: Secondary | ICD-10-CM

## 2012-07-05 DIAGNOSIS — I6529 Occlusion and stenosis of unspecified carotid artery: Secondary | ICD-10-CM

## 2012-07-11 ENCOUNTER — Other Ambulatory Visit: Payer: Self-pay | Admitting: Cardiothoracic Surgery

## 2012-07-11 DIAGNOSIS — R911 Solitary pulmonary nodule: Secondary | ICD-10-CM

## 2012-07-11 DIAGNOSIS — I2510 Atherosclerotic heart disease of native coronary artery without angina pectoris: Secondary | ICD-10-CM

## 2012-07-27 ENCOUNTER — Other Ambulatory Visit: Payer: Self-pay | Admitting: Cardiothoracic Surgery

## 2012-07-27 ENCOUNTER — Other Ambulatory Visit: Payer: Self-pay | Admitting: *Deleted

## 2012-07-27 LAB — CREATININE, SERUM: Creat: 0.62 mg/dL (ref 0.50–1.10)

## 2012-07-27 LAB — BUN: BUN: 15 mg/dL (ref 6–23)

## 2012-08-03 ENCOUNTER — Other Ambulatory Visit: Payer: Medicare Other

## 2012-08-03 ENCOUNTER — Ambulatory Visit: Payer: Medicare Other | Admitting: Cardiothoracic Surgery

## 2012-08-26 ENCOUNTER — Other Ambulatory Visit: Payer: Self-pay | Admitting: Cardiothoracic Surgery

## 2012-08-26 ENCOUNTER — Ambulatory Visit (INDEPENDENT_AMBULATORY_CARE_PROVIDER_SITE_OTHER): Payer: Medicare Other | Admitting: Cardiothoracic Surgery

## 2012-08-26 ENCOUNTER — Encounter: Payer: Self-pay | Admitting: Cardiothoracic Surgery

## 2012-08-26 ENCOUNTER — Ambulatory Visit
Admission: RE | Admit: 2012-08-26 | Discharge: 2012-08-26 | Disposition: A | Discharge status: Home / Self Care | Payer: Medicare Other | Source: Ambulatory Visit | Attending: Cardiothoracic Surgery | Admitting: Cardiothoracic Surgery

## 2012-08-26 VITALS — BP 145/86 | HR 110 | Resp 20 | Ht 70.0 in | Wt 193.0 lb

## 2012-08-26 DIAGNOSIS — J449 Chronic obstructive pulmonary disease, unspecified: Secondary | ICD-10-CM

## 2012-08-26 DIAGNOSIS — R911 Solitary pulmonary nodule: Secondary | ICD-10-CM

## 2012-08-26 DIAGNOSIS — Z85118 Personal history of other malignant neoplasm of bronchus and lung: Secondary | ICD-10-CM

## 2012-08-26 DIAGNOSIS — D381 Neoplasm of uncertain behavior of trachea, bronchus and lung: Secondary | ICD-10-CM

## 2012-08-26 DIAGNOSIS — I2510 Atherosclerotic heart disease of native coronary artery without angina pectoris: Secondary | ICD-10-CM

## 2012-08-26 MED ORDER — IOHEXOL 300 MG/ML  SOLN
75.0000 mL | Freq: Once | INTRAMUSCULAR | Status: AC | PRN
  Administered 2012-08-26: 75 mL via INTRAVENOUS

## 2012-08-26 NOTE — Patient Instructions (Signed)
Stop smoking

## 2012-08-26 NOTE — Progress Notes (Signed)
PCP is [name], MD Referring Provider is Barbie Banner, MD  Chief Complaint  Patient presents with  . Lung Lesion    6 month f/u with Chest CT, surveillance of Left lower lobe nodule    HPI: patient presents for 6 month CT scan followup -- 10 mm nodule in the superior segment of left lower lobe first noted by CT scan December 2012 with negative activity on PET scan in early 2013. Patient still smokes one and a half to 2 packs per day. She had a right upper lobectomy 20 years ago at [facility] with postoperative changes on PET-CT scan. The nodular density in the superior segment left lower lobe has not decreased in size any over 12 months and transthoracic needle biopsy is recommended due to risk of slow growing carcinoma.  Past Medical History  Diagnosis Date  . Thyroid disease   . Hypertension   . Coronary artery disease     Mild on cath 2011  . Hyperlipidemia   . Lung nodule   . Cancer     Lung 1995  . Sleep apnea   . GERD (gastroesophageal reflux disease)   . Jaw sarcoma     Past Surgical History  Procedure Date  . Vaginal hysterectomy   . Lung surgery   . Sarcoma     Jaw surgery    Family History  Problem Relation Age of Onset  . Hypertension    . Cancer    . Allergic rhinitis    . Heart disease      Social History History  Substance Use Topics  . Smoking status: Current Every Day Smoker -- 62 years    Types: Cigarettes, Cigars  . Smokeless tobacco: Not on file     Comment: 3-4 cigarettes a day  . Alcohol Use: Not on file    Current Outpatient Prescriptions  Medication Sig Dispense Refill  . aspirin 81 MG tablet Take 81 mg by mouth daily.        Marland Kitchen atenolol (TENORMIN) 25 MG tablet Take 25 mg by mouth daily.       . budesonide-formoterol (SYMBICORT) 160-4.5 MCG/ACT inhaler Inhale 2 puffs into the lungs 2 (two) times daily.      Marland Kitchen co-enzyme Q-10 30 MG capsule Take 100 mg by mouth daily.        . furosemide (LASIX) 20 MG tablet Take 20 mg by mouth  every other day.       . levothyroxine (SYNTHROID, LEVOTHROID) 100 MCG tablet Take 100 mcg by mouth daily.        Marland Kitchen lisinopril (PRINIVIL,ZESTRIL) 10 MG tablet Take 10 mg by mouth 2 (two) times daily.       Marland Kitchen LORazepam (ATIVAN) 1 MG tablet Take 1 mg by mouth every 8 (eight) hours.        . Melatonin 1 MG CAPS Take by mouth as needed.        . Multiple Vitamin (MULTIVITAMIN) tablet Take 1 tablet by mouth daily.      . NON FORMULARY Take 10 mg by mouth daily as needed. bc powder       . omeprazole (PRILOSEC) 40 MG capsule Take 40 mg by mouth daily.        . rosuvastatin (CRESTOR) 40 MG tablet Take 40 mg by mouth daily.        . vitamin B-12 (CYANOCOBALAMIN) 1000 MCG tablet Take 1,000 mcg by mouth daily.          Allergies  Allergen Reactions  . Penicillins     Review of Systems no weight loss increased anxiety over family illnesses no fever no hemoptysis she had a flu shot this fall  BP 145/86  Pulse 110  Resp 20  Ht 5\' 10"  (1.778 m)  Wt 193 lb (87.544 kg)  BMI 27.69 kg/m2  SpO2 93% Physical Exam Elderly female with COPD Scattered rhonchi bilaterally No palpable cervical adenopathy Cardiac rhythm regular  Diagnostic Tests: CT scan shows persistence of 10 - 11 mm nodule superior segment left lower lobe. Will proceed with transthoracic needle biopsy by interventional radiology and will review with patient on return visit  Impression: Left lower lobe lung nodule, possible slow-growing carcinoma with prior history of lung cancer. I believe that needle biopsy would be the best option as patient has a previous history lung cancer, continues to smoke, and the risk of possible postprocedural pneumothorax is worth the value of the cytology obtained. The patient would be a very poor candidate for thoracic surgical resection.   Plan:

## 2012-09-07 ENCOUNTER — Other Ambulatory Visit: Payer: Self-pay

## 2012-09-08 ENCOUNTER — Encounter (HOSPITAL_COMMUNITY): Payer: Self-pay

## 2012-09-08 ENCOUNTER — Other Ambulatory Visit: Payer: Self-pay | Admitting: Radiology

## 2012-09-13 ENCOUNTER — Ambulatory Visit (HOSPITAL_COMMUNITY)
Admission: RE | Admit: 2012-09-13 | Discharge: 2012-09-13 | Disposition: A | Discharge status: Home / Self Care | Payer: Medicare Other | Source: Ambulatory Visit | Attending: Interventional Radiology | Admitting: Interventional Radiology

## 2012-09-13 ENCOUNTER — Encounter (HOSPITAL_COMMUNITY): Payer: Self-pay

## 2012-09-13 ENCOUNTER — Ambulatory Visit (HOSPITAL_COMMUNITY)
Admission: RE | Admit: 2012-09-13 | Discharge: 2012-09-13 | Disposition: A | Discharge status: Home / Self Care | Payer: Medicare Other | Source: Ambulatory Visit | Attending: Cardiothoracic Surgery | Admitting: Cardiothoracic Surgery

## 2012-09-13 DIAGNOSIS — D381 Neoplasm of uncertain behavior of trachea, bronchus and lung: Secondary | ICD-10-CM | POA: Insufficient documentation

## 2012-09-13 LAB — CBC
MCHC: 33.2 g/dL (ref 30.0–36.0)
RDW: 13.1 % (ref 11.5–15.5)
WBC: 9.6 10*3/uL (ref 4.0–10.5)

## 2012-09-13 LAB — PROTIME-INR
INR: 0.89 (ref 0.00–1.49)
Prothrombin Time: 12 seconds (ref 11.6–15.2)

## 2012-09-13 LAB — APTT: aPTT: 34 seconds (ref 24–37)

## 2012-09-13 LAB — GLUCOSE, CAPILLARY: Glucose-Capillary: 123 mg/dL — ABNORMAL HIGH (ref 70–99)

## 2012-09-13 MED ORDER — SODIUM CHLORIDE 0.9 % IV SOLN
Freq: Once | INTRAVENOUS | Status: AC
  Administered 2012-09-13: 08:00:00 via INTRAVENOUS

## 2012-09-13 MED ORDER — MIDAZOLAM HCL 2 MG/2ML IJ SOLN
INTRAMUSCULAR | Status: DC | PRN
  Administered 2012-09-13: 1 mg via INTRAVENOUS

## 2012-09-13 MED ORDER — FENTANYL CITRATE 0.05 MG/ML IJ SOLN
INTRAMUSCULAR | Status: DC | PRN
  Administered 2012-09-13: 25 ug via INTRAVENOUS
  Administered 2012-09-13: 50 ug via INTRAVENOUS
  Administered 2012-09-13: 25 ug via INTRAVENOUS

## 2012-09-13 MED ORDER — FENTANYL CITRATE 0.05 MG/ML IJ SOLN
INTRAMUSCULAR | Status: AC
  Filled 2012-09-13: qty 4

## 2012-09-13 MED ORDER — MIDAZOLAM HCL 2 MG/2ML IJ SOLN
INTRAMUSCULAR | Status: AC
  Filled 2012-09-13: qty 4

## 2012-09-13 NOTE — ED Notes (Signed)
O2 2L/NCV applied.  Scouting pictures being done

## 2012-09-13 NOTE — ED Notes (Signed)
MD at bedside. 

## 2012-09-13 NOTE — Procedures (Signed)
Technically successful CT guided biopsy of left lower lobe pulmonary nodule.  No immediate post procedural complications.

## 2012-09-13 NOTE — ED Notes (Signed)
Pt reports some difficulty getting breath.  Sat 96. MD aware.  Dr Grace Isaac requests 25 mcg of Fentanyl given and to move pt off table quickly for comfort.

## 2012-09-13 NOTE — ED Notes (Signed)
Dr Grace Isaac at bedside, explaining procedure and answering questions.

## 2012-09-13 NOTE — ED Notes (Signed)
To C XRay

## 2012-09-13 NOTE — ED Notes (Signed)
Family updated as to patient's status.

## 2012-09-13 NOTE — Progress Notes (Signed)
Discharged home with son; biopsy site unremarkable; no c/o

## 2012-09-13 NOTE — H&P (Signed)
Alexandria Warren is an 79 y.o. female.   Chief Complaint: Hx lung Ca +smoker Left lung nodule; noted since 2012 Not smaller and still smoker Scheduled for Left low lobe nodule biopsy HPI: Rt lung surgery(lobectomy 1995)+ Ca; smoker; HTN; HLD; GERD  Past Medical History  Diagnosis Date  . Thyroid disease   . Hypertension   . Coronary artery disease     Mild on cath 2011  . Hyperlipidemia   . Lung nodule   . Cancer     Lung 1995  . Sleep apnea   . GERD (gastroesophageal reflux disease)   . Jaw sarcoma     Past Surgical History  Procedure Date  . Vaginal hysterectomy   . Lung surgery   . Sarcoma     Jaw surgery    Family History  Problem Relation Age of Onset  . Hypertension    . Cancer    . Allergic rhinitis    . Heart disease     Social History:  reports that she has been smoking Cigarettes and Cigars.  She has smoked for the past 62 years. She does not have any smokeless tobacco history on file. Her alcohol and drug histories not on file.  Allergies:  Allergies  Allergen Reactions  . Fish Allergy Nausea And Vomiting  . Penicillins Hives and Itching     (Not in a hospital admission)  Results for orders placed during the hospital encounter of 09/13/12 (from the past 48 hour(s))  APTT     Status: Normal   Collection Time   09/13/12  7:28 AM      Component Value Range Comment   aPTT 34  24 - 37 seconds   CBC     Status: Normal   Collection Time   09/13/12  7:28 AM      Component Value Range Comment   WBC 9.6  4.0 - 10.5 K/uL    RBC 4.51  3.87 - 5.11 MIL/uL    Hemoglobin 13.6  12.0 - 15.0 g/dL    HCT 29.5  62.1 - 30.8 %    MCV 90.9  78.0 - 100.0 fL    MCH 30.2  26.0 - 34.0 pg    MCHC 33.2  30.0 - 36.0 g/dL    RDW 65.7  84.6 - 96.2 %    Platelets 330  150 - 400 K/uL   PROTIME-INR     Status: Normal   Collection Time   09/13/12  7:28 AM      Component Value Range Comment   Prothrombin Time 12.0  11.6 - 15.2 seconds    INR 0.89  0.00 - 1.49   GLUCOSE,  CAPILLARY     Status: Abnormal   Collection Time   09/13/12  7:41 AM      Component Value Range Comment   Glucose-Capillary 123 (*) 70 - 99 mg/dL    No results found.  Review of Systems  Constitutional: Negative for fever.  Respiratory: Positive for cough.   Cardiovascular: Negative for chest pain.  Gastrointestinal: Negative for nausea and vomiting.  Neurological: Positive for weakness. Negative for headaches.  Psychiatric/Behavioral: The patient is nervous/anxious.     Blood pressure 193/103, pulse 105, temperature 96.9 F (36.1 C), temperature source Oral, resp. rate 18, height 5\' 10"  (1.778 m), weight 194 lb (87.998 kg), SpO2 96.00%. Physical Exam  Constitutional: She is oriented to person, place, and time.  Cardiovascular: Normal rate, regular rhythm and normal heart sounds.   No murmur heard.  Respiratory: Effort normal and breath sounds normal. She has no wheezes.  GI: Soft. Bowel sounds are normal. There is no tenderness.  Musculoskeletal: Normal range of motion.  Neurological: She is alert and oriented to person, place, and time.  Skin: Skin is warm and dry.  Psychiatric: She has a normal mood and affect. Her behavior is normal. Judgment and thought content normal.     Assessment/Plan Hx lung ca Rt lobectomy 1995 Now with LLL nodule -  X 2 yrs Scheduled for LLL nodule bx Pt aware of procedure benefits and risks and agreeable to proceed Consent signed and in chart  Cindi Ghazarian A 09/13/2012, 8:26 AM

## 2012-09-13 NOTE — ED Notes (Signed)
Denies any pain or discomfort.  Awaiting 11:00 cxray then move to SS C 8.

## 2012-09-13 NOTE — ED Notes (Signed)
O2 d/c'd 

## 2012-09-28 ENCOUNTER — Ambulatory Visit
Admission: RE | Admit: 2012-09-28 | Discharge: 2012-09-28 | Disposition: A | Discharge status: Home / Self Care | Payer: Medicare Other | Source: Ambulatory Visit | Attending: Radiation Oncology | Admitting: Radiation Oncology

## 2012-09-28 ENCOUNTER — Encounter: Payer: Self-pay | Admitting: Radiation Oncology

## 2012-09-28 VITALS — BP 188/84 | HR 91 | Temp 97.7°F | Resp 22 | Ht 69.0 in | Wt 199.4 lb

## 2012-09-28 DIAGNOSIS — R911 Solitary pulmonary nodule: Secondary | ICD-10-CM

## 2012-09-28 DIAGNOSIS — C3430 Malignant neoplasm of lower lobe, unspecified bronchus or lung: Secondary | ICD-10-CM | POA: Insufficient documentation

## 2012-09-28 NOTE — Progress Notes (Signed)
79 year old female. Current everyday smoker.   HX lung ca in 1995. S/P lobectomy and chemoradiation in 1995 at [facility] for right lung nodule. S/p CT guided left lower lobe pulmonary nodule on 09/13/12.   AX: fish and pencillin Hx of radiation therapy in 1995 at 1995 No indication of a pacemaker

## 2012-09-28 NOTE — Progress Notes (Signed)
Patient presents to the clinic today accompanied by her son, Alexandria Warren, for a consultation with Dr. Roselind Warren to discuss the role of radiation therapy in the treatment of left lower lobe pulmonary nodule. Patient is alert and oriented to person, place, and time. No distress noted. Steady gait noted. Pleasant affect noted. Patient denies pain at this time. Shortness of breath with minimal exertion. Patient reports productive cough with clear sputum. Blood pressure elevated. Encouraged patient to contact PCP, Dr. Jeanne Warren, because patient reports bp has been up recently. Patient reports headache associated with elevated NP. Patient reports that she is only able to sleep 2-3 hours per night despite taking melatonin. Patient denies painful or difficulty swallowing. Patient reports a great appetite. Patient reports nausea but, denies emesis the last couple day. Patient denies diplopia. Patient reports that with age her hearing has diminished. Reported all findings to Dr. Roselind Warren.

## 2012-09-28 NOTE — Progress Notes (Signed)
Complete PATIENT MEASURE OF DISTRESS worksheet with a score of 10 submitted to social work.

## 2012-09-28 NOTE — Progress Notes (Signed)
Radiation Oncology         [phone]) [phone] ________________________________  Initial outpatient Consultation  Name: Alexandria Warren MRN: [id number]  Date: 09/28/2012  DOB: October 07, 1933  MV:[id number],EXBM [name], MD  Donata Clay, Theron Arista, MD   REFERRING PHYSICIAN: Donata Clay, Theron Arista, MD  DIAGNOSIS:  clinical stage I non-small cell lung cancer  HISTORY OF PRESENT ILLNESS::Alexandria Warren is a 79 y.o. female who is seen out of the courtesy of Dr. Kathlee Nations Trigt for an opinion concerning radiation therapy as part of management of what appears to be early stage non-small cell lung cancer. Patient was found to have a nodule in the left lower lobe on routine imaging. this is been followed in light of its small size. More recently the nodule had increased in size. A PET scan was performed which showed no significant activity in this area but the lesion as above is quite small limiting the sensitivity of the PET scan. She did proceed to undergo CT-guided biopsy of this area with pathology consistent with adenocarcinoma. The patient was evaluated by cardiothoracic surgery and was not felt to be a surgical candidate.  PREVIOUS RADIATION THERAPY: Yes the patient received postoperative radiation therapy directed at the right upper lung area in 1995 at [facility] Patient stopped her radiation therapy short at 27 treatments in light of her severe skin reaction. She was originally scheduled to receive 30 treatments.  PAST MEDICAL HISTORY:  has a past medical history of Thyroid disease; Hypertension; Coronary artery disease; Hyperlipidemia; Lung nodule; Sleep apnea; GERD (gastroesophageal reflux disease); Jaw sarcoma; Lung cancer (1995); S/P chemotherapy, time since greater than 12 weeks (1995 ); and S/P radiation therapy (1995).    PAST SURGICAL HISTORY: Past Surgical History  Procedure Date  . Vaginal hysterectomy   . Lung surgery   . Sarcoma     Jaw surgery  . Abdominal hysterectomy   . Lung biopsy     FAMILY  HISTORY: family history includes Allergic rhinitis in her mother; Arthritis in her mother; Cancer in her mother; Heart disease in her mother; and Hypertension in her mother.  SOCIAL HISTORY:  reports that she has been smoking Cigarettes and Cigars.  She has smoked for the past 62 years. She does not have any smokeless tobacco history on file. She reports that she does not drink alcohol or use illicit drugs.  ALLERGIES: Fish allergy and Penicillins  MEDICATIONS:  Current Outpatient Prescriptions  Medication Sig Dispense Refill  . albuterol (PROVENTIL HFA;VENTOLIN HFA) 108 (90 BASE) MCG/ACT inhaler Inhale 2 puffs into the lungs every 6 (six) hours as needed. Shortness of breath      . aspirin 81 MG tablet Take 81 mg by mouth daily.        . Aspirin-Salicylamide-Caffeine (BC HEADACHE POWDER PO) Take 1 packet by mouth every 4 (four) hours as needed. headache      . atenolol (TENORMIN) 25 MG tablet Take 25 mg by mouth daily.       . budesonide-formoterol (SYMBICORT) 160-4.5 MCG/ACT inhaler Inhale 2 puffs into the lungs 2 (two) times daily as needed. Shortness of breath      . co-enzyme Q-10 30 MG capsule Take 30 mg by mouth daily.       . furosemide (LASIX) 20 MG tablet Take 20 mg by mouth every other day.       . levothyroxine (SYNTHROID, LEVOTHROID) 100 MCG tablet Take 100 mcg by mouth daily.        Marland Kitchen lisinopril (PRINIVIL,ZESTRIL) 10 MG tablet  Take 10 mg by mouth 2 (two) times daily.       Marland Kitchen LORazepam (ATIVAN) 1 MG tablet Take 1 mg by mouth 2 (two) times daily.       . Melatonin 1 MG CAPS Take 1 capsule by mouth at bedtime as needed. sleep      . Multiple Vitamin (MULTIVITAMIN) tablet Take 1 tablet by mouth daily.      Marland Kitchen omeprazole (PRILOSEC) 40 MG capsule Take 40 mg by mouth every morning.       Marland Kitchen Phenyleph-CPM-DM-Aspirin (ALKA-SELTZER PLUS COLD & COUGH PO) Take 1 tablet by mouth every 4 (four) hours as needed. Cold and sinus      . Potassium Gluconate 595 MG CAPS Take 1 capsule by mouth daily  as needed. Hand cramps      . rosuvastatin (CRESTOR) 40 MG tablet Take 40 mg by mouth daily.        . vitamin B-12 (CYANOCOBALAMIN) 1000 MCG tablet Take 1,000 mcg by mouth daily.          REVIEW OF SYSTEMS:  A 15 point review of systems is documented in the electronic medical record. This was obtained by the nursing staff. However, I reviewed this with the patient to discuss relevant findings and make appropriate changes.  She denies any pain in the left lower chest significant cough or hemoptysis. She denies any new bony pain headaches dizziness or blurred vision.   PHYSICAL EXAM:  height is 5\' 9"  (1.753 m) and weight is 199 lb 6.4 oz (90.447 kg). Her oral temperature is 97.7 F (36.5 C). Her blood pressure is 188/84 and her pulse is 91. Her respiration is 22 and oxygen saturation is 94%.   BP 188/84  Pulse 91  Temp 97.7 F (36.5 C) (Oral)  Resp 22  Ht 5\' 9"  (1.753 m)  Wt 199 lb 6.4 oz (90.447 kg)  BMI 29.45 kg/m2  SpO2 94%  General Appearance:    Alert, cooperative, no distress, appears stated age  Head:    Normocephalic, without obvious abnormality, atraumatic  Eyes:    PERRL, conjunctiva/corneas clear, EOM's intact,        Nose:   Nares normal, septum midline, mucosa normal, no drainage    or sinus tenderness  Throat:   Lips, mucosa, and tongue normal; dentures in place   Neck:   Supple, symmetrical, trachea midline, no adenopathy;    thyroid:  no enlargement/tenderness/nodules; scars along the chin area.     Back:     Symmetric, no curvature, ROM normal, no CVA tenderness  Lungs:     Clear to auscultation bilaterally, respirations unlabored  Chest Wall:    No tenderness or deformity long scar along the right lateral chest from prior thoracotomy radiation tattoos present in the anterior chest region. Patient has some hyperpigmentation changes in the right upper chest area    Heart:    Regular rate and rhythm, , no murmur, rub   or gallop     Abdomen:     Soft, non-tender, bowel  sounds active all four quadrants,    no masses, no organomegaly        Extremities:   Extremities normal, atraumatic, no cyanosis or edema  Pulses:   2+ and symmetric all extremities  Skin:   Skin color, texture, turgor normal, no rashes or lesions  Lymph nodes:   Cervical, supraclavicular, and axillary nodes normal  Neurologic:   normal strength, sensation and reflexes    throughout  LABORATORY DATA:  Lab Results  Component Value Date   WBC 9.6 09/13/2012   HGB 13.6 09/13/2012   HCT 41.0 09/13/2012   MCV 90.9 09/13/2012   PLT 330 09/13/2012   Lab Results  Component Value Date   NA 140 08/20/2011   K 4.5 08/20/2011   CL 101 08/20/2011   CO2 34* 08/20/2011   No results found for this basename: ALT, AST, GGT, ALKPHOS, BILITOT     RADIOGRAPHY: Dg Chest 1 View  09/13/2012  *RADIOLOGY REPORT*  Clinical Data: Left lower lobe nodule biopsy.  CHEST - 1 VIEW  Comparison: Multiple exams, including 08/26/2012  Findings: Volume loss of postoperative findings in the right lung noted.  No pneumothorax observed.  No pleural effusion.  Ill-defined density in the left midlung corresponds with the postbiopsy hemorrhage.  The biopsy nodule is indistinct.  IMPRESSION:  1.  No pneumothorax or pleural effusion, status post biopsy.  There is some faint left mid lung opacity probably reflecting post biopsy hemorrhage in the lung. 2.  Volume loss of postoperative findings in the right lung.   Original Report Authenticated By: Gaylyn Rong, M.D.    Ct Biopsy  09/13/2012  *RADIOLOGY REPORT*  Indication: History of right-sided lung cancer, now with indeterminate nodule within the superior segment of the left lower lobe.  CT GUIDED LEFT LOWER LOBE NODULE CORE NEEDLE BIOPSY  Comparisons: Chest CT - 09/22/2012; 01/29/2012; 08/21/2011; PET CT - 09/29/2011  Intravenous medications: Fentanyl 100 mcg IV; Versed 1 mg IV  Contrast: None  Sedation time: 21 minutes  CT Fluoroscopy time: 18 seconds  Complications: None  immediate  TECHNIQUE/FINDINGS:  Informed consent was obtained from the patient following an explanation of the procedure, risks, benefits and alternatives. The patient understands, agrees and consents for the procedure. All questions were addressed.  A time out was performed prior to the initiation of the procedure.  The patient was positioned left lateral decubitus on the CT table and a limited chest CT was performed for procedural planning demonstrating grossly unchanged appearance of approximately 0.8 x 1.1 cm nodule within the superior segment of the left lower lobe (image 10, series 2).  The operative site was prepped and draped in the usual sterile fashion.  Under sterile conditions and local anesthesia, a 17 gauge coaxial needle was advanced into the peripheral aspect of the nodule.  This was followed by the acquisition of a solitary core needle biopsy with an 18 gauge core needle biopsy device.  Limited post procedural chest CT demonstrate a minimal amount of peri biopsy hemorrhage but was negative for pneumothorax development.  The co-axial needle was removed and hemostasis was achieved with manual compression.  A dressing was placed.  The patient tolerated the procedure well without immediate postprocedural complication.  The patient was escorted to have an upright chest radiograph.  IMPRESSION:  Technically successful CT guided core needle biopsy of indeterminate nodule within the superior segment of the left lower lobe.   Original Report Authenticated By: Tacey Ruiz, MD       IMPRESSION: Clinical stage I non-small cell lung cancer presenting in the left lower lung region. The patient would be a good candidate for definitive treatment with stereotactic body radiotherapy. I anticipate between 3 and 5 treatments.  PLAN: Simulation and planning next week. I spent 60 minutes minutes face to face with the patient and more than 50% of that time was spent in counseling and/or coordination of care.     ------------------------------------------------  -----------------------------------  Billie Lade, PhD, MD

## 2012-09-28 NOTE — Progress Notes (Signed)
See progress note under physician encounter. 

## 2012-09-30 NOTE — Addendum Note (Signed)
Encounter addended by: Ander Wamser Mintz Nam Vossler, RN on: 09/30/2012  5:58 PM<BR>     Documentation filed: Charges VN

## 2012-10-04 ENCOUNTER — Ambulatory Visit
Admission: RE | Admit: 2012-10-04 | Discharge: 2012-10-04 | Disposition: A | Discharge status: Home / Self Care | Payer: Medicare Other | Source: Ambulatory Visit | Attending: Radiation Oncology | Admitting: Radiation Oncology

## 2012-10-04 ENCOUNTER — Telehealth: Payer: Self-pay | Admitting: Radiation Oncology

## 2012-10-04 DIAGNOSIS — Z7982 Long term (current) use of aspirin: Secondary | ICD-10-CM | POA: Insufficient documentation

## 2012-10-04 DIAGNOSIS — C3430 Malignant neoplasm of lower lobe, unspecified bronchus or lung: Secondary | ICD-10-CM | POA: Insufficient documentation

## 2012-10-04 DIAGNOSIS — Z510 Encounter for antineoplastic radiation therapy: Secondary | ICD-10-CM | POA: Insufficient documentation

## 2012-10-04 DIAGNOSIS — Z79899 Other long term (current) drug therapy: Secondary | ICD-10-CM | POA: Insufficient documentation

## 2012-10-04 DIAGNOSIS — R0602 Shortness of breath: Secondary | ICD-10-CM | POA: Insufficient documentation

## 2012-10-04 MED ORDER — LORAZEPAM 0.5 MG PO TABS
0.5000 mg | ORAL_TABLET | Freq: Once | ORAL | Status: AC
Start: 2012-10-04 — End: 2012-10-04
  Administered 2012-10-04: 0.5 mg via SUBLINGUAL
  Filled 2012-10-04: qty 1

## 2012-10-04 NOTE — Telephone Encounter (Signed)
Met w patient to discuss RO billing. Pt did have some financial concerns and advised, she is on a fixed income and single. Pt was given and EPP and MCD application to complete and return on her next visit.  Dx:  Malignant neoplasm of lower lobe, bronchus, or lung - Primary 162.5  Attending Rad: JK  Rad Tx:   SBRT x5

## 2012-10-06 ENCOUNTER — Ambulatory Visit
Admission: RE | Admit: 2012-10-06 | Discharge: 2012-10-06 | Disposition: A | Discharge status: Home / Self Care | Payer: Medicare Other | Source: Ambulatory Visit | Attending: Radiation Oncology | Admitting: Radiation Oncology

## 2012-10-06 NOTE — Progress Notes (Signed)
  Radiation Oncology         [phone] ________________________________  Name: Alexandria Warren MRN: [id number]  Date: 10/04/2012  DOB: [birth year]  RESPIRATORY MOTION MANAGEMENT SIMULATION  NARRATIVE:  In order to account for effect of respiratory motion on target structures and other organs in the planning and delivery of radiotherapy, this patient underwent respiratory motion management simulation.  To accomplish this, when the patient was brought to the CT simulation planning suite, 4D respiratoy motion management CT images were obtained.  The CT images were loaded into the planning software.  Then, using a variety of tools including Cine, MIP, and standard views, the target volume and planning target volumes (PTV) were delineated.  Avoidance structures were contoured.  Treatment planning then occurred.  Dose volume histograms were generated and reviewed for each of the requested structure.  The resulting plan was carefully reviewed and approved today.  Billie Lade, PhD, MD

## 2012-10-06 NOTE — Progress Notes (Signed)
  Radiation Oncology         [phone] ________________________________  Name: Alexandria Warren MRN: [id number]  Date: 10/04/2012  DOB: [birth year]  SIMULATION AND TREATMENT PLANNING NOTE  DIAGNOSIS:  Clinical stage I non-small cell lung, presenting in the superior segment of the left lower lobe  NARRATIVE:  The patient was brought to the CT Simulation planning suite.  Identity was confirmed.  All relevant records and images related to the planned course of therapy were reviewed.  The patient freely provided informed written consent to proceed with treatment after reviewing the details related to the planned course of therapy. The consent form was witnessed and verified by the simulation staff.  Then, the patient was set-up in a stable reproducible  supine position for radiation therapy.  CT images were obtained.  Surface markings were placed.  The CT images were loaded into the planning software.  Then the target and avoidance structures were contoured.  Treatment planning then occurred.  The radiation prescription was entered and confirmed.  Then, I designed and supervised the construction of a total of 1 medically necessary complex treatment devices.  I have requested : 3D Simulation  I have requested a DVH of the following structures: GTV, PTV esophagus heart and lungs.  I have ordered:dose calc.  PLAN:  The patient will receive 60 Gy in 5 fractions using SBRT techniques.  ________________________________  Billie Lade, PhD, MD

## 2012-10-17 ENCOUNTER — Ambulatory Visit
Admission: RE | Admit: 2012-10-17 | Discharge: 2012-10-17 | Disposition: A | Discharge status: Home / Self Care | Payer: Medicare Other | Source: Ambulatory Visit | Attending: Radiation Oncology | Admitting: Radiation Oncology

## 2012-10-17 MED ORDER — LORAZEPAM 0.5 MG PO TABS
0.5000 mg | ORAL_TABLET | Freq: Once | ORAL | Status: AC
  Administered 2012-10-17: 0.5 mg via ORAL
  Filled 2012-10-17: qty 1

## 2012-10-17 NOTE — Progress Notes (Signed)
  Radiation Oncology         [phone] ________________________________  Name: Alexandria Warren MRN: [id number]  Date: 10/17/2012  DOB: Feb 12, 1934  Stereotactic Body Radiotherapy Treatment Procedure Note  NARRATIVE:  Alexandria Warren was brought to the stereotactic radiation treatment machine and placed supine on the CT couch. The patient was set up for stereotactic body radiotherapy on the body fix pillow.  3D TREATMENT PLANNING AND DOSIMETRY:  The patient's radiation plan was reviewed and approved prior to starting treatment.  It showed 3-dimensional radiation distributions overlaid onto the planning CT.  The [facility] for the target structures as well as the organs at risk were reviewed. The documentation of this is filed in the radiation oncology EMR.  SIMULATION VERIFICATION:  The patient underwent CT imaging on the treatment unit.  These were carefully aligned to document that the ablative radiation dose would cover the target volume and maximally spare the nearby organs at risk according to the planned distribution.  SPECIAL TREATMENT PROCEDURE: Alexandria Warren received high dose ablative stereotactic body radiotherapy to the planned target volume without unforeseen complications. Treatment was delivered uneventfully. The high doses associated with stereotactic body radiotherapy and the significant potential risks require careful treatment set up and patient monitoring constituting a special treatment procedure   STEREOTACTIC TREATMENT MANAGEMENT:  Following delivery, the patient was evaluated clinically. The patient tolerated treatment without significant acute effects, and was discharged to home in stable condition.    PLAN: Continue treatment as planned.  ________________________________  Billie Lade, PhD, MD

## 2012-10-18 ENCOUNTER — Encounter: Payer: Self-pay | Admitting: Radiation Oncology

## 2012-10-18 ENCOUNTER — Telehealth: Payer: Self-pay | Admitting: Radiation Oncology

## 2012-10-18 NOTE — Telephone Encounter (Signed)
INDIGENT APPROVED 100% FAMILY SIZE: 1 HH INC: 13404.00 MOD POV: 11,670.00-14,587.50 VALID DATES: 100% INDIGENT PLEASE APPLY DISCOUNT TO ANY  6 MONTHS PRIOR AND ALL CURRENT BILL.   [facility] $400

## 2012-10-19 ENCOUNTER — Ambulatory Visit
Admission: RE | Admit: 2012-10-19 | Discharge: 2012-10-19 | Disposition: A | Discharge status: Home / Self Care | Payer: Medicare Other | Source: Ambulatory Visit | Attending: Radiation Oncology | Admitting: Radiation Oncology

## 2012-10-19 NOTE — Progress Notes (Signed)
  Radiation Oncology         [phone] ________________________________  Name: Alexandria Warren MRN: [id number]  Date: 10/19/2012  DOB: 1934/03/22  Stereotactic Body Radiotherapy Treatment Procedure Note  NARRATIVE:  Alexandria Warren was brought to the stereotactic radiation treatment machine and placed supine on the CT couch. The patient was set up for stereotactic body radiotherapy on the body fix pillow.  3D TREATMENT PLANNING AND DOSIMETRY:  The patient's radiation plan was reviewed and approved prior to starting treatment.  It showed 3-dimensional radiation distributions overlaid onto the planning CT.  The [facility] for the target structures as well as the organs at risk were reviewed. The documentation of this is filed in the radiation oncology EMR.  SIMULATION VERIFICATION:  The patient underwent CT imaging on the treatment unit.  These were carefully aligned to document that the ablative radiation dose would cover the target volume and maximally spare the nearby organs at risk according to the planned distribution.  SPECIAL TREATMENT PROCEDURE: Alexandria Warren received high dose ablative stereotactic body radiotherapy to the planned target volume without unforeseen complications. Treatment was delivered uneventfully. The high doses associated with stereotactic body radiotherapy and the significant potential risks require careful treatment set up and patient monitoring constituting a special treatment procedure   STEREOTACTIC TREATMENT MANAGEMENT:  Following delivery, the patient was evaluated clinically. The patient tolerated treatment without significant acute effects, and was discharged to home in stable condition.    PLAN: Continue treatment as planned.  ________________________________  Billie Lade, PhD, MD

## 2012-10-21 ENCOUNTER — Encounter: Payer: Self-pay | Admitting: Radiation Oncology

## 2012-10-21 ENCOUNTER — Ambulatory Visit
Admission: RE | Admit: 2012-10-21 | Discharge: 2012-10-21 | Disposition: A | Discharge status: Home / Self Care | Payer: Medicare Other | Source: Ambulatory Visit | Attending: Radiation Oncology | Admitting: Radiation Oncology

## 2012-10-24 ENCOUNTER — Ambulatory Visit: Payer: Medicare Other | Admitting: Radiation Oncology

## 2012-10-26 ENCOUNTER — Ambulatory Visit
Admission: RE | Admit: 2012-10-26 | Discharge: 2012-10-26 | Disposition: A | Discharge status: Home / Self Care | Payer: Medicare Other | Source: Ambulatory Visit | Attending: Radiation Oncology | Admitting: Radiation Oncology

## 2012-10-26 NOTE — Progress Notes (Signed)
  Radiation Oncology         [phone] ________________________________  Name: Keishawna Carranza MRN: [id number]  Date: 10/26/2012  DOB: 02-Jun-1934  Stereotactic Body Radiotherapy Treatment Procedure Note  NARRATIVE:  Kahliya Preast was brought to the stereotactic radiation treatment machine and placed supine on the CT couch. The patient was set up for stereotactic body radiotherapy on the body fix pillow.  3D TREATMENT PLANNING AND DOSIMETRY:  The patient's radiation plan was reviewed and approved prior to starting treatment.  It showed 3-dimensional radiation distributions overlaid onto the planning CT.  The [facility] for the target structures as well as the organs at risk were reviewed. The documentation of this is filed in the radiation oncology EMR.  SIMULATION VERIFICATION:  The patient underwent CT imaging on the treatment unit.  These were carefully aligned to document that the ablative radiation dose would cover the target volume and maximally spare the nearby organs at risk according to the planned distribution.  SPECIAL TREATMENT PROCEDURE: Shenna Sherrard received high dose ablative stereotactic body radiotherapy to the planned target volume without unforeseen complications. Treatment was delivered uneventfully. The high doses associated with stereotactic body radiotherapy and the significant potential risks require careful treatment set up and patient monitoring constituting a special treatment procedure   STEREOTACTIC TREATMENT MANAGEMENT:  Following delivery, the patient was evaluated clinically. The patient tolerated treatment without significant acute effects, and was discharged to home in stable condition.    PLAN: Continue treatment as planned.  ________________________________  Billie Lade, PhD, MD

## 2012-10-31 ENCOUNTER — Ambulatory Visit
Admission: RE | Admit: 2012-10-31 | Discharge: 2012-10-31 | Disposition: A | Discharge status: Home / Self Care | Payer: Medicare Other | Source: Ambulatory Visit | Attending: Radiation Oncology | Admitting: Radiation Oncology

## 2012-10-31 ENCOUNTER — Encounter: Payer: Self-pay | Admitting: Radiation Oncology

## 2012-10-31 MED ORDER — ALBUTEROL SULFATE (2.5 MG/3ML) 0.083% IN NEBU
2.5000 mg | INHALATION_SOLUTION | Freq: Once | RESPIRATORY_TRACT | Status: AC
  Administered 2012-10-31: 2.5 mg via RESPIRATORY_TRACT
  Filled 2012-10-31: qty 3

## 2012-10-31 NOTE — Progress Notes (Signed)
  Radiation Oncology         [phone] ________________________________  Name: Cordell Coke MRN: [id number]  Date: 10/31/2012  DOB: 1934/08/13  Stereotactic Body Radiotherapy Treatment Procedure Note  NARRATIVE:  Taygan Sanna was brought to the stereotactic radiation treatment machine and placed supine on the CT couch. The patient was set up for stereotactic body radiotherapy on the body fix pillow.  3D TREATMENT PLANNING AND DOSIMETRY:  The patient's radiation plan was reviewed and approved prior to starting treatment.  It showed 3-dimensional radiation distributions overlaid onto the planning CT.  The [facility] for the target structures as well as the organs at risk were reviewed. The documentation of this is filed in the radiation oncology EMR.  SIMULATION VERIFICATION:  The patient underwent CT imaging on the treatment unit.  These were carefully aligned to document that the ablative radiation dose would cover the target volume and maximally spare the nearby organs at risk according to the planned distribution.  SPECIAL TREATMENT PROCEDURE: Hikari Donati received high dose ablative stereotactic body radiotherapy to the planned target volume without unforeseen complications. Treatment was delivered uneventfully. The high doses associated with stereotactic body radiotherapy and the significant potential risks require careful treatment set up and patient monitoring constituting a special treatment procedure   STEREOTACTIC TREATMENT MANAGEMENT:  Following delivery, the patient was evaluated clinically. The patient tolerated treatment without significant acute effects, and was discharged to home in stable condition.    PLAN: Continue treatment as planned.  ________________________________  Billie Lade, PhD, MD

## 2012-10-31 NOTE — Progress Notes (Signed)
[facility]    Radiation Oncology [address] [place]     Alexandria Warren, M.D. [place], [place] [address]               Alexandria Warren, M.D., Ph.D. Phone: [phone]      Alexandria Warren A. Kathrynn Running, M.D. Fax: [phone]      Radene Gunning, M.D., Ph.D.         Lurline Hare, M.D.         Grayland Jack, M.D Weekly Treatment Management Note  Name: Alexandria Warren     MRN: [id number]        CSN: [id number] Date: 10/31/2012      DOB: [birth year]  CC: [name], MD         Andrey Campanile    Status: Outpatient  Diagnosis: The encounter diagnosis was Malignant neoplasm of lower lobe, bronchus, or lung, left.  Current Dose: 60  Current Fraction: 5  Planned Dose: 60 Gy  Narrative: Alexandria Warren was seen today for weekly treatment management. The chart was checked and CBCT  were reviewed. She has been tolerating her radiation therapy well. However this morning she woke up with shortness of breath. She denies any chills or fever or significant cough. She denies any hemoptysis or chest pain.  Fish allergy and Penicillins  Current Outpatient Prescriptions  Medication Sig Dispense Refill  . albuterol (PROVENTIL HFA;VENTOLIN HFA) 108 (90 BASE) MCG/ACT inhaler Inhale 2 puffs into the lungs every 6 (six) hours as needed. Shortness of breath      . aspirin 81 MG tablet Take 81 mg by mouth daily.        . Aspirin-Salicylamide-Caffeine (BC HEADACHE POWDER PO) Take 1 packet by mouth every 4 (four) hours as needed. headache      . atenolol (TENORMIN) 25 MG tablet Take 25 mg by mouth daily.       . budesonide-formoterol (SYMBICORT) 160-4.5 MCG/ACT inhaler Inhale 2 puffs into the lungs 2 (two) times daily as needed. Shortness of breath      . co-enzyme Q-10 30 MG capsule Take 30 mg by mouth daily.       . furosemide (LASIX) 20 MG tablet Take 20 mg by mouth every other day.       . levothyroxine (SYNTHROID, LEVOTHROID) 100 MCG tablet Take 100 mcg by mouth daily.        Marland Kitchen lisinopril  (PRINIVIL,ZESTRIL) 10 MG tablet Take 10 mg by mouth 2 (two) times daily.       Marland Kitchen LORazepam (ATIVAN) 1 MG tablet Take 1 mg by mouth 2 (two) times daily.       . Melatonin 1 MG CAPS Take 1 capsule by mouth at bedtime as needed. sleep      . Multiple Vitamin (MULTIVITAMIN) tablet Take 1 tablet by mouth daily.      Marland Kitchen omeprazole (PRILOSEC) 40 MG capsule Take 40 mg by mouth every morning.       Marland Kitchen Phenyleph-CPM-DM-Aspirin (ALKA-SELTZER PLUS COLD & COUGH PO) Take 1 tablet by mouth every 4 (four) hours as needed. Cold and sinus      . Potassium Gluconate 595 MG CAPS Take 1 capsule by mouth daily as needed. Hand cramps      . rosuvastatin (CRESTOR) 40 MG tablet Take 40 mg by mouth daily.        . vitamin B-12 (CYANOCOBALAMIN) 1000 MCG tablet Take 1,000 mcg by mouth daily.         No current facility-administered medications  for this encounter.     Physical Examination:  oral temperature is 97.5 F (36.4 C). Her blood pressure is 147/80 and her pulse is 86. Her respiration is 18 and oxygen saturation is 98%.    Wt Readings from Last 3 Encounters:  09/13/12 194 lb (87.998 kg)  08/26/12 193 lb (87.544 kg)  06/27/12 189 lb (85.73 kg)     Lungs - the patient was noted to be tachypneic but with supplemental oxygen and she was more comfortable she was noted to have some wheezing in the lungs. Heart has regular rhythm and rate  Abdomen is soft and non tender with normal bowel sounds  Assessment:  Patient tolerating treatments well. The reason behind her shortness of breath earlier today is unknown.  Plan: Patient was given a nebulizer in our department which helped her breathing significantly. I should mention that she did not use her inhaler earlier today. I have recommended she contact her primary care physician for further evaluation of this issue.

## 2012-10-31 NOTE — Progress Notes (Signed)
Received patient in the clinic today following final treatment for PUT with Dr. Roselind Messier. Patient is alert and oriented to person, place, and time. No distress noted. Patient being pushed in wheelchair due to shortness of breath. Pleasant affect noted. Patient reports a headache due to episode of shortness of breath. Patient denies pain elsewhere. Patient reports that she awoke with SOB but, reports it got better once she got up and "stirring. Patient reports that when she had to lay flat on the take for treatment that "smothered feeling" came over her again. Patient denies using inhaler this morning. Patient reports that she continues to take lasix as directed. Slight non pitting edema of ankles noted. Applied oxygen therapy 3 liters via nasal cannula. Patient denies having oxygen therapy in her home. Patient reports a productive cough with white sputum. Patient denies painful or difficulty swallowing. Reported all findings to Dr. Roselind Messier.

## 2012-10-31 NOTE — Progress Notes (Signed)
Kennan, RT administered ordered Albuterol nebulizer treatment. Patient tolerated well. Vitals now within normal limits. Strongly encouraged patient to contact PCP reference need for nebulizer and home O2. Discharged patient home in the care of her son. Encouraged to contact our staff with needs. Patient expressed appreciation and verbalized understanding.

## 2012-11-03 ENCOUNTER — Telehealth: Payer: Self-pay | Admitting: Radiation Oncology

## 2012-11-03 NOTE — Telephone Encounter (Signed)
Received call from Merla Riches that she had spoken with this patient via phone and it sounded as if she was having trouble breathing. Delice Bison reports advising the patient to call 911. Phoned patient at home. Patient confirms that she is having some difficulty breathing but, denies it emergent. Patient reports that her son is on the way to her house. Encouraged patient strongly to have her son take her to the emergency room for evaluation if she was unable to get in with her PCP. Again, stressed the importance of follow up with her PCP reference need for nebulizer and possibly home oxygen therapy. Patient verbalized understanding of all reviewed. Encouraged patient to contact staff with future needs or call 911 if breathing became worse.

## 2012-11-09 ENCOUNTER — Encounter: Payer: Self-pay | Admitting: Radiation Oncology

## 2012-11-09 NOTE — Progress Notes (Signed)
  Radiation Oncology         [phone] ________________________________  Name: Cherylene Ferrufino MRN: [id number]  Date: 11/09/2012  DOB: August 08, 1934  End of Treatment Note  Diagnosis:    clinical stage I non-small cell lung cancer    Indication for treatment:  definitive       Radiation treatment dates:   2/24,2/26, 2/28, 3/5, 10/31/12  Site/dose:   Left lower lobe nodule 60 Gy in 5 fractions  Beams/energy:   SBRT techiques, rapid arc, 6 MV photons  Narrative: The patient tolerated radiation treatment relatively well however on the last day of treatment she developed an acute exacerbation of her COPD which responded well to a nebulizer tx.  Plan: The patient has completed radiation treatment. The patient will return to radiation oncology clinic for routine followup in one month. I advised them to call or return sooner if they have any questions or concerns related to their recovery or treatment.  -----------------------------------  Billie Lade, PhD, MD

## 2012-11-22 NOTE — Progress Notes (Signed)
  Radiation Oncology         [phone] ________________________________  Name: Alexandria Warren MRN: [id number]  Date: 10/21/2012  DOB: Sep 07, 1933  Stereotactic Body Radiotherapy Treatment Procedure Note  NARRATIVE:  Alexandria Warren was brought to the stereotactic radiation treatment machine and placed supine on the CT couch. The patient was set up for stereotactic body radiotherapy on the body fix pillow.  3D TREATMENT PLANNING AND DOSIMETRY:  The patient's radiation plan was reviewed and approved prior to starting treatment.  It showed 3-dimensional radiation distributions overlaid onto the planning CT.  The [facility] for the target structures as well as the organs at risk were reviewed. The documentation of this is filed in the radiation oncology EMR.  SIMULATION VERIFICATION:  The patient underwent CT imaging on the treatment unit.  These were carefully aligned to document that the ablative radiation dose would cover the target volume and maximally spare the nearby organs at risk according to the planned distribution.  SPECIAL TREATMENT PROCEDURE: Alexandria Warren received high dose ablative stereotactic body radiotherapy to the planned target volume without unforeseen complications. Treatment was delivered uneventfully. The high doses associated with stereotactic body radiotherapy and the significant potential risks require careful treatment set up and patient monitoring constituting a special treatment procedure   STEREOTACTIC TREATMENT MANAGEMENT:  Following delivery, the patient was evaluated clinically. The patient tolerated treatment without significant acute effects, and was discharged to home in stable condition.    PLAN: Continue treatment as planned.  ________________________________

## 2012-11-28 ENCOUNTER — Ambulatory Visit
Admission: RE | Admit: 2012-11-28 | Discharge: 2012-11-28 | Disposition: A | Discharge status: Home / Self Care | Payer: Medicare Other | Source: Ambulatory Visit | Attending: Radiation Oncology | Admitting: Radiation Oncology

## 2012-11-28 ENCOUNTER — Encounter: Payer: Self-pay | Admitting: Radiation Oncology

## 2012-11-28 NOTE — Progress Notes (Signed)
Radiation Oncology         [phone] ________________________________  Name: Alexandria Warren MRN: [id number]  Date: 11/28/2012  DOB: 04-Jun-1934  Follow-Up Visit Note  CC: [name], MD  [name], MD  Diagnosis:   Clinical stage I non-small cell lung cancer   Interval Since Last Radiation:  4  weeks the patient completed SBRT for a left lower lobe nodule  Narrative:  The patient returns today for routine follow-up.  She continues to have dyspnea particularly with exertion. She denies any pain in the chest area. Patient was given a nebulizer which helps with her overall situation. She denies any significant cough or hemoptysis.   She has not been given any oxygen.  She continues to smoke and I have strongly recommended she stop this issue. This would certainly help with her breathing situation.                       ALLERGIES:  is allergic to fish allergy and penicillins.  Meds:  Current Outpatient Prescriptions  Medication Sig Dispense Refill  . albuterol (PROVENTIL HFA;VENTOLIN HFA) 108 (90 BASE) MCG/ACT inhaler Inhale 2 puffs into the lungs every 6 (six) hours as needed. Shortness of breath      . alclomethasone (ACLOVATE) 0.05 % cream       . aspirin 81 MG tablet Take 81 mg by mouth daily.        . Aspirin-Salicylamide-Caffeine (BC HEADACHE POWDER PO) Take 1 packet by mouth every 4 (four) hours as needed. headache      . atenolol (TENORMIN) 25 MG tablet Take 25 mg by mouth daily.       Marland Kitchen co-enzyme Q-10 30 MG capsule Take 30 mg by mouth daily.       Marland Kitchen econazole nitrate 1 % cream       . furosemide (LASIX) 20 MG tablet Take 20 mg by mouth every other day.       . levothyroxine (SYNTHROID, LEVOTHROID) 100 MCG tablet Take 100 mcg by mouth daily.        Marland Kitchen lisinopril (PRINIVIL,ZESTRIL) 10 MG tablet Take 10 mg by mouth 2 (two) times daily.       Marland Kitchen LORazepam (ATIVAN) 1 MG tablet Take 1 mg by mouth 2 (two) times daily.       . Melatonin 1 MG CAPS Take 1 capsule by mouth at  bedtime as needed. sleep      . Multiple Vitamin (MULTIVITAMIN) tablet Take 1 tablet by mouth daily.      Marland Kitchen omeprazole (PRILOSEC) 40 MG capsule Take 40 mg by mouth every morning.       Marland Kitchen Phenyleph-CPM-DM-Aspirin (ALKA-SELTZER PLUS COLD & COUGH PO) Take 1 tablet by mouth every 4 (four) hours as needed. Cold and sinus      . Potassium Gluconate 595 MG CAPS Take 1 capsule by mouth daily as needed. Hand cramps      . rosuvastatin (CRESTOR) 40 MG tablet Take 40 mg by mouth daily.        . vitamin B-12 (CYANOCOBALAMIN) 1000 MCG tablet Take 1,000 mcg by mouth daily.        . budesonide-formoterol (SYMBICORT) 160-4.5 MCG/ACT inhaler Inhale 2 puffs into the lungs 2 (two) times daily as needed. Shortness of breath       No current facility-administered medications for this encounter.    Physical Findings: The patient is in no acute distress. Patient is alert and oriented.  weight is 200  lb 6.4 oz (90.901 kg). Her oral temperature is 98.3 F (36.8 C). Her blood pressure is 153/74 and her pulse is 93. Her respiration is 22 and oxygen saturation is 89%. .  The patient was given supplemental oxygen in our department which helped her be more comfortable.  Lab Findings: Lab Results  Component Value Date   WBC 9.6 09/13/2012   HGB 13.6 09/13/2012   HCT 41.0 09/13/2012   MCV 90.9 09/13/2012   PLT 330 09/13/2012      Radiographic Findings: No results found.  Impression:  The patient is recovering from the effects of radiation.  Breathing continues to be an issue for her.  Plan:  Routine followup in 2 months. After this appointment,  the patient was setup for a CT scan of the chest to assess her response to SBRT.  I will also schedule the patient for consultation with Dr. Marcelyn Bruins in pulmonary medicine to see if she may be a candidate for supplemental oxygen or fine-tuning of her pulmonary medications.  _____________________________________  -----------------------------------  Billie Lade,  PhD, MD

## 2012-11-28 NOTE — Progress Notes (Signed)
Patient presents to the clinic today accompanied by her son for follow up with Dr. Roselind Messier. Patient is alert and oriented to person, place, and time. Patient very short of breath. Following ambulation from elevator to exam room oxygen saturation was 89%. Applied oxygen therapy 2 liters via nasal cannula then, oxygen saturation rose to 99%. Steady gait noted. Pleasant affect noted. Patient reports that she is using nebulizer now but, still has not been prescribed home oxygen. Patient wants to question Dr. Roselind Messier about safe allergy medication. Also, patient wants a recommendation from Dr. Roselind Messier on a new PCP. Patient reports a persistent productive cough related to allergies first thing in the morning then, patient reports during the remainder of the day the cough is persistently dry. Patient reports continued shortness of breath and fatigue. Patient reports left posterior shoulder pain that is intermittent and 9 on a scale of 0-10. Patient reports that her entire right arm continues to hurt related to arthritis. Reported all findings to Dr. Roselind Messier.

## 2012-11-29 ENCOUNTER — Telehealth: Payer: Self-pay | Admitting: *Deleted

## 2012-11-29 NOTE — Telephone Encounter (Signed)
CALLED PATIENT TO INFORM OF CONSULTATION WITH DR. CLANCE, ON 12-21-12 - ARRIVAL TIME - 3:45 PM, SPOKE WITH PATIENT AND SHE IS AWARE OF THIS APPT.

## 2012-12-21 ENCOUNTER — Institutional Professional Consult (permissible substitution): Payer: Medicare Other | Admitting: Pulmonary Disease

## 2013-01-11 ENCOUNTER — Ambulatory Visit (INDEPENDENT_AMBULATORY_CARE_PROVIDER_SITE_OTHER): Payer: Medicare Other | Admitting: Pulmonary Disease

## 2013-01-11 ENCOUNTER — Encounter: Payer: Self-pay | Admitting: Pulmonary Disease

## 2013-01-11 ENCOUNTER — Ambulatory Visit (INDEPENDENT_AMBULATORY_CARE_PROVIDER_SITE_OTHER)
Admission: RE | Admit: 2013-01-11 | Discharge: 2013-01-11 | Disposition: A | Discharge status: Home / Self Care | Payer: Medicare Other | Source: Ambulatory Visit | Attending: Pulmonary Disease | Admitting: Pulmonary Disease

## 2013-01-11 VITALS — BP 160/70 | HR 94 | Temp 98.2°F | Ht 67.75 in | Wt 202.6 lb

## 2013-01-11 DIAGNOSIS — R0600 Dyspnea, unspecified: Secondary | ICD-10-CM

## 2013-01-11 DIAGNOSIS — R0989 Other specified symptoms and signs involving the circulatory and respiratory systems: Secondary | ICD-10-CM

## 2013-01-11 DIAGNOSIS — R0609 Other forms of dyspnea: Secondary | ICD-10-CM

## 2013-01-11 MED ORDER — PREDNISONE 10 MG PO TABS: ORAL_TABLET | ORAL | Status: DC

## 2013-01-11 NOTE — Assessment & Plan Note (Signed)
The patient has significant dyspnea on exertion that has worsened over the last one year, however she has also continued to smoke.  Complicating all of this, is a prior right upper lobectomy, and a new primary in her left upper lobe for which she has received radiation.  It is unclear how much of her shortness of breath is from underlying obstructive lung disease, but I suspect that she is currently having an episode of acute asthmatic bronchitis given her increased symptoms.  I will treat her with a short course of prednisone, but also stressed to her the importance of total smoking cessation.  We'll intensify her bronchodilator regimen by adding Spiriva, and she will also need oxygen with her significant desaturation with exertion.  We'll schedule the patient for PFTs so that we can assess the severity of her lung disease.

## 2013-01-11 NOTE — Progress Notes (Signed)
  Subjective:    Patient ID: Alexandria Warren, female    DOB: 1934/05/02, 79 y.o.   MRN: [id number]  HPI The patient is a 79 year old female who I've been asked to see for management of dyspnea on exertion.  The patient has a complicated history of lung cancer in the past, and is status post right upper lobectomy in 1995 by her history.  She has now been diagnosed with a second malignancy in her left upper lobe, and has just finished radiation therapy.  Patient complains of worsening dyspnea on exertion over the last one year, and this got in to the point where she will get winded just walking room to room.  She has a chronic cough because of ongoing smoking, but currently is only bringing up clear mucus.  She denies any chest pain or tightness, but has had some intermittent lower extremity edema.  She currently is on symbicort as needed, and uses albuterol nebulizers 4-6 times a day.  Unfortunately, she continues to smoke one pack per day.  She has not had PFTs since her lung surgery many years ago, and has not had a recent chest x-ray.   Review of Systems  Constitutional: Positive for unexpected weight change ( increased weight). Negative for fever.  HENT: Positive for rhinorrhea, sneezing, postnasal drip and sinus pressure. Negative for ear pain, nosebleeds, congestion, sore throat, trouble swallowing and dental problem.   Eyes: Negative for redness and itching.  Respiratory: Positive for cough, chest tightness, shortness of breath and wheezing. Negative for choking.   Cardiovascular: Negative for palpitations and leg swelling ( feet).  Gastrointestinal: Negative for nausea and vomiting.       Acid heartburn  Genitourinary: Negative for dysuria.  Musculoskeletal: Joint swelling:  hand swellng.  Skin: Negative for rash.  Neurological: Positive for headaches.  Hematological: Does not bruise/bleed easily.  Psychiatric/Behavioral: Positive for dysphoric mood. The patient is nervous/anxious.         Objective:   Physical Exam Constitutional:  Obese female, no acute distress  HENT:  Nares patent without discharge  Oropharynx without exudate, palate and uvula are normal  Eyes:  Perrla, eomi, no scleral icterus  Neck:  No JVD, no TMG  Cardiovascular:  Normal rate, regular rhythm, no rubs or gallops.  No murmurs        Intact distal pulses but decreased  Pulmonary :  Decreased breath sounds, no stridor or respiratory distress   No rales, rhonchi, or wheezing  Abdominal:  Soft, nondistended, bowel sounds present.  No tenderness noted.   Musculoskeletal:  mild lower extremity edema noted.  Lymph Nodes:  No cervical lymphadenopathy noted  Skin:  No cyanosis noted  Neurologic:  Alert, appropriate, moves all 4 extremities without obvious deficit.         Assessment & Plan:

## 2013-01-11 NOTE — Patient Instructions (Signed)
Will check a cxr today, and also schedule you for breathing tests in 4 weeks with a visit with me on the same day. You must stop smoking.  You will only continue to worsen if you keep smoking. Prednisone as directed. You need to take symbicort 2puffs am and pm everyday, no matter what. Start spiriva one inhalation every am no matter what. You are to use albuterol inhaler or nebulizer treatment only for rescue. Will start you on oxygen with activity and sleeping.

## 2013-01-20 ENCOUNTER — Encounter: Payer: Self-pay | Admitting: Radiation Oncology

## 2013-01-20 DIAGNOSIS — Z923 Personal history of irradiation: Secondary | ICD-10-CM | POA: Insufficient documentation

## 2013-01-26 ENCOUNTER — Ambulatory Visit
Admission: RE | Admit: 2013-01-26 | Discharge: 2013-01-26 | Disposition: A | Discharge status: Home / Self Care | Payer: Medicare Other | Source: Ambulatory Visit | Attending: Radiation Oncology | Admitting: Radiation Oncology

## 2013-01-26 ENCOUNTER — Encounter: Payer: Self-pay | Admitting: Radiation Oncology

## 2013-01-26 ENCOUNTER — Telehealth: Payer: Self-pay | Admitting: *Deleted

## 2013-01-26 DIAGNOSIS — R51 Headache: Secondary | ICD-10-CM | POA: Insufficient documentation

## 2013-01-26 DIAGNOSIS — Z9981 Dependence on supplemental oxygen: Secondary | ICD-10-CM | POA: Insufficient documentation

## 2013-01-26 DIAGNOSIS — Z923 Personal history of irradiation: Secondary | ICD-10-CM | POA: Insufficient documentation

## 2013-01-26 DIAGNOSIS — Z79899 Other long term (current) drug therapy: Secondary | ICD-10-CM | POA: Insufficient documentation

## 2013-01-26 DIAGNOSIS — R059 Cough, unspecified: Secondary | ICD-10-CM | POA: Insufficient documentation

## 2013-01-26 DIAGNOSIS — C3490 Malignant neoplasm of unspecified part of unspecified bronchus or lung: Secondary | ICD-10-CM | POA: Insufficient documentation

## 2013-01-26 DIAGNOSIS — R05 Cough: Secondary | ICD-10-CM | POA: Insufficient documentation

## 2013-01-26 LAB — BUN AND CREATININE (CC13)
BUN: 15.6 mg/dL (ref 7.0–26.0)
Creatinine: 0.7 mg/dL (ref 0.6–1.1)

## 2013-01-26 NOTE — Progress Notes (Addendum)
Pt denies pain, fatigue, does report loss of appetite, dry cough she attributes to pollen, SOB w/exertion. She uses O2 @ 2L/min prn during day and every night. She is trying to quit smoking, smokes 2-3 cigs almost daily.  Pt began coughing while in exam room; gave her water and put her on O2 @ 2L/min per [place].  Pt's coughing resolved soon after.  10:40 am pt has seen Dr Roselind Messier, labs ordered. Pt on her own O2 @ 2L/ min; offered to push pt upstairs to lobby in wheelchair, offered several times, but pt refused. Walked w/pt to elevator, instructed her to go to registration for lab appointment. Pt verbalized understanding. Pt no longer coughing, no SOB noted.

## 2013-01-26 NOTE — Progress Notes (Signed)
Radiation Oncology         [phone] ________________________________  Name: Alexandria Warren MRN: [id number]  Date: 01/26/2013  DOB: 1933-09-01  Follow-Up Visit Note  CC: [name], MD  [name], MD  Diagnosis:   Clinical stage I non-small cell lung cancer  Interval Since Last Radiation:  3  months  Narrative:  The patient returns today for routine follow-up.  She seems to be doing recently well. She was placed on oxygen by Dr. Shelle Iron her pulmonologist since my last followup. TreatmentTtreatmenthis is helpfRoutine followup in one montul for the patient. She has occasional dry cough but no hemoptysis. She denies any pain in the chest area. She does have some headaches but attributes this to stress. She denies any new bony pain.                             ALLERGIES:  is allergic to fish allergy and penicillins.  Meds: Current Outpatient Prescriptions  Medication Sig Dispense Refill  . albuterol (PROVENTIL HFA;VENTOLIN HFA) 108 (90 BASE) MCG/ACT inhaler Inhale 2 puffs into the lungs every 6 (six) hours as needed. Shortness of breath      . albuterol (PROVENTIL) (2.5 MG/3ML) 0.083% nebulizer solution Take 2.5 mg by nebulization every 6 (six) hours as needed for wheezing or shortness of breath.      Marland Kitchen alclomethasone (ACLOVATE) 0.05 % cream       . aspirin 81 MG tablet Take 81 mg by mouth daily.        . Aspirin-Salicylamide-Caffeine (BC HEADACHE POWDER PO) Take 1 packet by mouth every 4 (four) hours as needed. headache      . atenolol (TENORMIN) 25 MG tablet Take 25 mg by mouth daily.       . budesonide-formoterol (SYMBICORT) 160-4.5 MCG/ACT inhaler Inhale 2 puffs into the lungs 2 (two) times daily as needed. Shortness of breath      . co-enzyme Q-10 30 MG capsule Take 30 mg by mouth daily.       Marland Kitchen econazole nitrate 1 % cream       . furosemide (LASIX) 20 MG tablet Take 20 mg by mouth every other day.       . levothyroxine (SYNTHROID, LEVOTHROID) 100 MCG tablet Take 100 mcg  by mouth daily.        Marland Kitchen lisinopril (PRINIVIL,ZESTRIL) 10 MG tablet Take 10 mg by mouth 2 (two) times daily.       Marland Kitchen LORazepam (ATIVAN) 1 MG tablet Take 1 mg by mouth 2 (two) times daily.       . Melatonin 1 MG CAPS Take 1 capsule by mouth at bedtime as needed. sleep      . Multiple Vitamin (MULTIVITAMIN) tablet Take 1 tablet by mouth daily.      Marland Kitchen omeprazole (PRILOSEC) 40 MG capsule Take 40 mg by mouth every morning.       Marland Kitchen Phenyleph-CPM-DM-Aspirin (ALKA-SELTZER PLUS COLD & COUGH PO) Take 1 tablet by mouth every 4 (four) hours as needed. Cold and sinus      . Potassium Gluconate 595 MG CAPS Take 1 capsule by mouth daily as needed. Hand cramps      . rosuvastatin (CRESTOR) 40 MG tablet Take 40 mg by mouth daily.        . vitamin B-12 (CYANOCOBALAMIN) 1000 MCG tablet Take 1,000 mcg by mouth daily.         No current facility-administered medications for this  encounter.    Physical Findings: The patient is in no acute distress. Patient is alert and oriented.  weight is 204 lb 1.6 oz (92.579 kg). Her oral temperature is 98.8 F (37.1 C). Her blood pressure is 139/65 and her pulse is 87. Her respiration is 20 and oxygen saturation is 97%. .  No no palpable supraclavicular or axillary adenopathy. The lungs are clear except for some crackles in the left base. The heart has a regular rhythm and rate she has oxygen in place, 2 L by nasal cannula.  Lab Findings: Lab Results  Component Value Date   WBC 9.6 09/13/2012   HGB 13.6 09/13/2012   HCT 41.0 09/13/2012   MCV 90.9 09/13/2012   PLT 330 09/13/2012    Radiographic Findings: Dg Chest 2 View  01/11/2013   *RADIOLOGY REPORT*  Clinical Data: Short of breath and cough  CHEST - 2 VIEW  Comparison: 09/13/2012  Findings: Postop lobectomy right upper lobe, unchanged.  Negative for infiltrate or effusion.  No heart failure.  No lung nodule or mass.  IMPRESSION: Postop changes right upper lobe are stable.  No superimposed acute abnormality.   Original  Report Authenticated By: Janeece Riggers, M.D.    Impression:  The patient is recovering from the effects of radiation.  No evidence of recurrence on clinical exam today  Plan:  Patient will be scheduled for a chest CT scan to assess her response to stereotactic body radiotherapy treatment.  Routine followup in 3 months  _____________________________________  -----------------------------------  Billie Lade, PhD, MD

## 2013-01-26 NOTE — Telephone Encounter (Signed)
CALLED PATIENT TO INFORM OF TEST, LVM FOR A RETURN CALL 

## 2013-02-02 NOTE — Progress Notes (Signed)
Quick Note:  [email or web address] on pt's home and cell #s  When calling cell #, it went directly to VM ______

## 2013-02-06 ENCOUNTER — Emergency Department (HOSPITAL_COMMUNITY): Payer: Medicare Other

## 2013-02-06 ENCOUNTER — Emergency Department (HOSPITAL_COMMUNITY)
Admission: EM | Admit: 2013-02-06 | Discharge: 2013-02-06 | Disposition: A | Discharge status: Home / Self Care | Payer: Medicare Other | Attending: Emergency Medicine | Admitting: Emergency Medicine

## 2013-02-06 ENCOUNTER — Encounter (HOSPITAL_COMMUNITY): Payer: Self-pay | Admitting: *Deleted

## 2013-02-06 DIAGNOSIS — IMO0002 Reserved for concepts with insufficient information to code with codable children: Secondary | ICD-10-CM | POA: Insufficient documentation

## 2013-02-06 DIAGNOSIS — C411 Malignant neoplasm of mandible: Secondary | ICD-10-CM | POA: Diagnosis not present

## 2013-02-06 DIAGNOSIS — I2510 Atherosclerotic heart disease of native coronary artery without angina pectoris: Secondary | ICD-10-CM | POA: Diagnosis not present

## 2013-02-06 DIAGNOSIS — R0602 Shortness of breath: Secondary | ICD-10-CM | POA: Diagnosis present

## 2013-02-06 DIAGNOSIS — E785 Hyperlipidemia, unspecified: Secondary | ICD-10-CM | POA: Diagnosis not present

## 2013-02-06 DIAGNOSIS — C3490 Malignant neoplasm of unspecified part of unspecified bronchus or lung: Secondary | ICD-10-CM | POA: Insufficient documentation

## 2013-02-06 DIAGNOSIS — K219 Gastro-esophageal reflux disease without esophagitis: Secondary | ICD-10-CM | POA: Diagnosis not present

## 2013-02-06 DIAGNOSIS — Z7982 Long term (current) use of aspirin: Secondary | ICD-10-CM | POA: Insufficient documentation

## 2013-02-06 DIAGNOSIS — E079 Disorder of thyroid, unspecified: Secondary | ICD-10-CM | POA: Diagnosis not present

## 2013-02-06 DIAGNOSIS — J449 Chronic obstructive pulmonary disease, unspecified: Secondary | ICD-10-CM | POA: Insufficient documentation

## 2013-02-06 DIAGNOSIS — I10 Essential (primary) hypertension: Secondary | ICD-10-CM | POA: Insufficient documentation

## 2013-02-06 DIAGNOSIS — F17200 Nicotine dependence, unspecified, uncomplicated: Secondary | ICD-10-CM | POA: Diagnosis not present

## 2013-02-06 DIAGNOSIS — R51 Headache: Secondary | ICD-10-CM | POA: Diagnosis not present

## 2013-02-06 DIAGNOSIS — J4489 Other specified chronic obstructive pulmonary disease: Secondary | ICD-10-CM | POA: Insufficient documentation

## 2013-02-06 DIAGNOSIS — Z79899 Other long term (current) drug therapy: Secondary | ICD-10-CM | POA: Insufficient documentation

## 2013-02-06 DIAGNOSIS — Z923 Personal history of irradiation: Secondary | ICD-10-CM | POA: Insufficient documentation

## 2013-02-06 LAB — BASIC METABOLIC PANEL
Chloride: 99 mEq/L (ref 96–112)
Creatinine, Ser: 0.52 mg/dL (ref 0.50–1.10)
GFR calc Af Amer: >90 mL/min (ref >90)
Sodium: 138 mEq/L (ref 135–145)

## 2013-02-06 LAB — CBC
HCT: 39 % (ref 36.0–46.0)
Platelets: 321 10*3/uL (ref 150–400)
RDW: 13.3 % (ref 11.5–15.5)
WBC: 7.6 10*3/uL (ref 4.0–10.5)

## 2013-02-06 LAB — PRO B NATRIURETIC PEPTIDE: Pro B Natriuretic peptide (BNP): 185.3 pg/mL (ref 0–450)

## 2013-02-06 MED ORDER — METHYLPREDNISOLONE SODIUM SUCC 125 MG IJ SOLR
125.0000 mg | Freq: Once | INTRAMUSCULAR | Status: AC
  Administered 2013-02-06: 125 mg via INTRAVENOUS
  Filled 2013-02-06: qty 2

## 2013-02-06 MED ORDER — IPRATROPIUM BROMIDE 0.02 % IN SOLN
0.5000 mg | Freq: Once | RESPIRATORY_TRACT | Status: AC
  Administered 2013-02-06: 0.5 mg via RESPIRATORY_TRACT
  Filled 2013-02-06: qty 2.5

## 2013-02-06 MED ORDER — IOHEXOL 350 MG/ML SOLN
100.0000 mL | Freq: Once | INTRAVENOUS | Status: AC | PRN
  Administered 2013-02-06: 100 mL via INTRAVENOUS

## 2013-02-06 MED ORDER — PREDNISONE (PAK) 10 MG PO TABS: ORAL_TABLET | ORAL | Status: DC

## 2013-02-06 MED ORDER — ALBUTEROL SULFATE (5 MG/ML) 0.5% IN NEBU
5.0000 mg | INHALATION_SOLUTION | Freq: Once | RESPIRATORY_TRACT | Status: AC
  Administered 2013-02-06: 5 mg via RESPIRATORY_TRACT
  Filled 2013-02-06: qty 1

## 2013-02-06 NOTE — ED Notes (Signed)
Patient transported to CT 

## 2013-02-06 NOTE — ED Notes (Signed)
Pt reports hx of lung cancer, states breathing has become more difficult over past few days.

## 2013-02-06 NOTE — ED Provider Notes (Signed)
History     CSN: [id number]  Arrival date & time 02/06/13  1212   First MD Initiated Contact with Patient 02/06/13 1404      Chief Complaint  Patient presents with  . Shortness of Breath    HPI Comments: Pt has COPD.  She has history of lung ca.  She is on home o2 and unfortunately she does still smoke occasionally.  Her breathing has been worse over these last few days.  Patient is a 79 y.o. female presenting with shortness of breath. The history is provided by the patient.  Shortness of Breath Severity:  Severe Onset quality:  Gradual Duration: SHe chronically is short of breath but the last few days have been worse. Timing:  Constant Progression:  Worsening Chronicity:  Recurrent Relieved by:  Nothing Ineffective treatments:  Inhaler Associated symptoms: headaches   Associated symptoms: no chest pain, no fever and no sore throat     Past Medical History  Diagnosis Date  . Thyroid disease   . Hypertension   . Coronary artery disease     Mild on cath 2011  . Hyperlipidemia   . Lung nodule   . Sleep apnea   . GERD (gastroesophageal reflux disease)   . Jaw sarcoma   . Lung cancer 1995  . S/P chemotherapy, time since greater than 12 weeks 1995     Chemoradiation at [facility] right upper lobe lung  . S/P radiation therapy 1995    right upper lobe lung  . Hx of radiation therapy 2/24, 2/26, 2/28, 3/5, 10/31/12    LLL lung- 60 gray in 5 fx, SBRT    Past Surgical History  Procedure Laterality Date  . Vaginal hysterectomy    . Lung surgery    . Sarcoma      Jaw surgery  . Abdominal hysterectomy    . Lung biopsy      Family History  Problem Relation Age of Onset  . Cancer Mother     mother  . Hypertension Mother   . Heart disease Mother   . Allergic rhinitis Mother   . Arthritis Mother     History  Substance Use Topics  . Smoking status: Current Every Day Smoker -- 0.75 packs/day for 62 years    Types: Cigarettes, Cigars  . Smokeless tobacco: Not on file   Comment: 1/2-3/4PPD, 01/26/13 2-3 cigs daily  . Alcohol Use: No    OB History   Grav Para Term Preterm Abortions TAB SAB Ect Mult Living                  Review of Systems  Constitutional: Negative for fever.  HENT: Negative for sore throat.   Respiratory: Positive for shortness of breath.   Cardiovascular: Negative for chest pain.  Neurological: Positive for headaches.    Allergies  Fish allergy and Penicillins  Home Medications   Current Outpatient Rx  Name  Route  Sig  Dispense  Refill  . albuterol (PROVENTIL) (2.5 MG/3ML) 0.083% nebulizer solution   Nebulization   Take 2.5 mg by nebulization every 6 (six) hours as needed for wheezing or shortness of breath.         Marland Kitchen alclomethasone (ACLOVATE) 0.05 % cream   Topical   Apply 1 application topically 2 (two) times daily. Under breast         . aspirin 81 MG tablet   Oral   Take 81 mg by mouth daily.           Marland Kitchen  atenolol (TENORMIN) 25 MG tablet   Oral   Take 25 mg by mouth daily.          . budesonide-formoterol (SYMBICORT) 160-4.5 MCG/ACT inhaler   Inhalation   Inhale 2 puffs into the lungs 2 (two) times daily as needed. Shortness of breath         . co-enzyme Q-10 30 MG capsule   Oral   Take 30 mg by mouth daily.          Marland Kitchen econazole nitrate 1 % cream   Topical   Apply 1 application topically 2 (two) times daily. Under breast         . furosemide (LASIX) 20 MG tablet   Oral   Take 20 mg by mouth every other day.          . levothyroxine (SYNTHROID, LEVOTHROID) 100 MCG tablet   Oral   Take 100 mcg by mouth daily.           Marland Kitchen lisinopril (PRINIVIL,ZESTRIL) 10 MG tablet   Oral   Take 10 mg by mouth 2 (two) times daily.          Marland Kitchen LORazepam (ATIVAN) 1 MG tablet   Oral   Take 1 mg by mouth 2 (two) times daily.          . Multiple Vitamin (MULTIVITAMIN) tablet   Oral   Take 1 tablet by mouth daily.         Marland Kitchen omeprazole (PRILOSEC) 40 MG capsule   Oral   Take 40 mg by mouth  every morning.          . Potassium Gluconate 595 MG CAPS   Oral   Take 1 capsule by mouth once a week. Hand cramps   Takes once a week on friday         . rosuvastatin (CRESTOR) 40 MG tablet   Oral   Take 40 mg by mouth daily.           . vitamin B-12 (CYANOCOBALAMIN) 1000 MCG tablet   Oral   Take 1,000 mcg by mouth daily.           . Aspirin-Salicylamide-Caffeine (BC HEADACHE POWDER PO)   Oral   Take 1 packet by mouth every 4 (four) hours as needed. headache         . Phenyleph-CPM-DM-Aspirin (ALKA-SELTZER PLUS COLD & COUGH PO)   Oral   Take 1 tablet by mouth every 4 (four) hours as needed. Cold and sinus         . predniSONE (STERAPRED UNI-PAK) 10 MG tablet      Take 6 tabs by mouth daily  for 2 days, then 5 tabs for 2 days, then 4 tabs for 2 days, then 3 tabs for 2 days, 2 tabs for 2 days, then 1 tab by mouth daily for 2 days   42 tablet   0     BP 155/66  Pulse 87  Temp(Src) 98.8 F (37.1 C) (Oral)  Resp 20  SpO2 96%  Physical Exam  Nursing note and vitals reviewed. Constitutional: She appears well-developed and well-nourished. No distress.  HENT:  Head: Normocephalic and atraumatic.  Right Ear: External ear normal.  Left Ear: External ear normal.  Eyes: Conjunctivae are normal. Right eye exhibits no discharge. Left eye exhibits no discharge. No scleral icterus.  Neck: Neck supple. No tracheal deviation present.  Cardiovascular: Normal rate, regular rhythm and intact distal pulses.   Pulmonary/Chest: Effort normal and breath  sounds normal. No stridor. No respiratory distress. She has no wheezes. She has no rales.  Slight decreased breath sounds left lung, no rales, no wheezes   Abdominal: Soft. Bowel sounds are normal. She exhibits no distension. There is no tenderness. There is no rebound and no guarding.  Musculoskeletal: She exhibits no edema and no tenderness.  Neurological: She is alert. She has normal strength. No sensory deficit. Cranial  nerve deficit:  no gross defecits noted. She exhibits normal muscle tone. She displays no seizure activity. Coordination normal.  Skin: Skin is warm and dry. No rash noted.  Psychiatric: She has a normal mood and affect.    ED Course  Procedures (including critical care time) EKG Normal sinus rhythm rate 86 First degree AV block Normal axis, normal intervals No acute ST-T wave changes EKG not significantly changed from prior EKG except for rate change Labs Reviewed  BASIC METABOLIC PANEL - Abnormal; Notable for the following:    Glucose, Bld 101 (*)    All other components within normal limits  D-DIMER, QUANTITATIVE - Abnormal; Notable for the following:    D-Dimer, Quant 0.93 (*)    All other components within normal limits  CBC  PRO B NATRIURETIC PEPTIDE   Dg Chest 2 View  02/06/2013   *RADIOLOGY REPORT*  Clinical Data: Cough and shortness of breath today.  History of lung cancer.  Smoker.  CHEST - 2 VIEW  Comparison: 01/11/2013.  Findings: The heart remains normal in size.  Stable right apical pleural thickening and surgical clips and staples.  Stable minimal diffuse peribronchial thickening and accentuation of the interstitial markings.  The left lung remains mildly hyperexpanded. Mild scoliosis.  IMPRESSION: No acute abnormality.  Stable right postoperative changes and mild changes of COPD.   Original Report Authenticated By: Beckie Salts, M.D.   Ct Angio Chest W/cm &/or Wo Cm  02/06/2013   *RADIOLOGY REPORT*  Clinical Data: Shortness of breath and history of lung carcinoma with prior surgery and radiation therapy.  CT ANGIOGRAPHY CHEST  Technique:  Multidetector CT imaging of the chest using the standard protocol during bolus administration of intravenous contrast. Multiplanar reconstructed images including MIPs were obtained and reviewed to evaluate the vascular anatomy.  Contrast: OMNIPAQUE IOHEXOL 350 MG/ML SOLN  Comparison: CT of the chest on 08/26/2012  Findings: The  pulmonary arteries are adequately opacified.  There is no evidence of pulmonary embolism.  Stable pleural parenchymal changes and postsurgical changes of the right hemithorax present. Stable nodule in the superior segment of the left lower lobe measures approximately 9 mm.  No acute infiltrate, edema, pneumothorax or pleural effusion is identified.  The heart size is normal.  No pericardial fluid is seen.  Stable atherosclerosis of the thoracic aorta without evidence of thoracic aortic aneurysm.  Bony structures are unremarkable.  Stable osteopenia.  No bony lesions or fractures are identified.  IMPRESSION: No acute findings.  Stable postsurgical changes of the right hemithorax and left lower lobe lung nodule.   Original Report Authenticated By: Irish Lack, M.D.     1. COPD (chronic obstructive pulmonary disease)       MDM  Pt improved with treatment in the ED.  She responded to albuterol and prednisone.  CT scan does not show any PE.  Will dc home on oral steroid taper.  Pt encouraged to stop smoking.        Celene Kras, MD 02/06/13 [phone]

## 2013-02-07 ENCOUNTER — Ambulatory Visit (HOSPITAL_COMMUNITY): Payer: Medicare Other

## 2013-02-10 ENCOUNTER — Ambulatory Visit: Payer: Medicare Other | Admitting: Pulmonary Disease

## 2013-02-22 ENCOUNTER — Encounter (HOSPITAL_COMMUNITY): Payer: Self-pay

## 2013-02-22 ENCOUNTER — Emergency Department (HOSPITAL_COMMUNITY)
Admission: EM | Admit: 2013-02-22 | Discharge: 2013-02-22 | Disposition: A | Discharge status: Home / Self Care | Payer: Medicare Other | Attending: Emergency Medicine | Admitting: Emergency Medicine

## 2013-02-22 ENCOUNTER — Emergency Department (HOSPITAL_COMMUNITY): Payer: Medicare Other

## 2013-02-22 DIAGNOSIS — Z880 Allergy status to penicillin: Secondary | ICD-10-CM | POA: Insufficient documentation

## 2013-02-22 DIAGNOSIS — E079 Disorder of thyroid, unspecified: Secondary | ICD-10-CM | POA: Insufficient documentation

## 2013-02-22 DIAGNOSIS — J441 Chronic obstructive pulmonary disease with (acute) exacerbation: Secondary | ICD-10-CM | POA: Insufficient documentation

## 2013-02-22 DIAGNOSIS — Z9861 Coronary angioplasty status: Secondary | ICD-10-CM | POA: Insufficient documentation

## 2013-02-22 DIAGNOSIS — Z85118 Personal history of other malignant neoplasm of bronchus and lung: Secondary | ICD-10-CM | POA: Insufficient documentation

## 2013-02-22 DIAGNOSIS — Z7982 Long term (current) use of aspirin: Secondary | ICD-10-CM | POA: Insufficient documentation

## 2013-02-22 DIAGNOSIS — Z8709 Personal history of other diseases of the respiratory system: Secondary | ICD-10-CM | POA: Insufficient documentation

## 2013-02-22 DIAGNOSIS — Z923 Personal history of irradiation: Secondary | ICD-10-CM | POA: Insufficient documentation

## 2013-02-22 DIAGNOSIS — F17200 Nicotine dependence, unspecified, uncomplicated: Secondary | ICD-10-CM | POA: Insufficient documentation

## 2013-02-22 DIAGNOSIS — Z79899 Other long term (current) drug therapy: Secondary | ICD-10-CM | POA: Insufficient documentation

## 2013-02-22 DIAGNOSIS — R05 Cough: Secondary | ICD-10-CM | POA: Insufficient documentation

## 2013-02-22 DIAGNOSIS — E785 Hyperlipidemia, unspecified: Secondary | ICD-10-CM | POA: Insufficient documentation

## 2013-02-22 DIAGNOSIS — R059 Cough, unspecified: Secondary | ICD-10-CM | POA: Insufficient documentation

## 2013-02-22 DIAGNOSIS — I2510 Atherosclerotic heart disease of native coronary artery without angina pectoris: Secondary | ICD-10-CM | POA: Insufficient documentation

## 2013-02-22 DIAGNOSIS — Z9889 Other specified postprocedural states: Secondary | ICD-10-CM | POA: Insufficient documentation

## 2013-02-22 DIAGNOSIS — R0989 Other specified symptoms and signs involving the circulatory and respiratory systems: Secondary | ICD-10-CM | POA: Insufficient documentation

## 2013-02-22 DIAGNOSIS — K219 Gastro-esophageal reflux disease without esophagitis: Secondary | ICD-10-CM | POA: Insufficient documentation

## 2013-02-22 DIAGNOSIS — I10 Essential (primary) hypertension: Secondary | ICD-10-CM | POA: Insufficient documentation

## 2013-02-22 DIAGNOSIS — Z9221 Personal history of antineoplastic chemotherapy: Secondary | ICD-10-CM | POA: Insufficient documentation

## 2013-02-22 DIAGNOSIS — Z8669 Personal history of other diseases of the nervous system and sense organs: Secondary | ICD-10-CM | POA: Insufficient documentation

## 2013-02-22 DIAGNOSIS — IMO0002 Reserved for concepts with insufficient information to code with codable children: Secondary | ICD-10-CM | POA: Insufficient documentation

## 2013-02-22 LAB — BASIC METABOLIC PANEL
Chloride: 95 mEq/L — ABNORMAL LOW (ref 96–112)
GFR calc Af Amer: >90 mL/min (ref >90)
Potassium: 3.7 mEq/L (ref 3.5–5.1)

## 2013-02-22 LAB — CBC
HCT: 34.6 % — ABNORMAL LOW (ref 36.0–46.0)
Platelets: 268 10*3/uL (ref 150–400)
RDW: 13.7 % (ref 11.5–15.5)
WBC: 12.9 10*3/uL — ABNORMAL HIGH (ref 4.0–10.5)

## 2013-02-22 LAB — PRO B NATRIURETIC PEPTIDE: Pro B Natriuretic peptide (BNP): 273.6 pg/mL (ref 0–450)

## 2013-02-22 MED ORDER — IPRATROPIUM BROMIDE 0.02 % IN SOLN
0.5000 mg | Freq: Once | RESPIRATORY_TRACT | Status: AC
  Administered 2013-02-22: 0.5 mg via RESPIRATORY_TRACT
  Filled 2013-02-22: qty 2.5

## 2013-02-22 MED ORDER — ALBUTEROL SULFATE (5 MG/ML) 0.5% IN NEBU
5.0000 mg | INHALATION_SOLUTION | Freq: Once | RESPIRATORY_TRACT | Status: AC
  Administered 2013-02-22: 5 mg via RESPIRATORY_TRACT
  Filled 2013-02-22: qty 1

## 2013-02-22 MED ORDER — PREDNISONE 10 MG PO TABS: 10.0000 mg | ORAL_TABLET | Freq: Every day | ORAL | Status: DC

## 2013-02-22 MED ORDER — MOXIFLOXACIN HCL 400 MG PO TABS: 400.0000 mg | ORAL_TABLET | Freq: Every day | ORAL | Status: DC

## 2013-02-22 MED ORDER — LEVOFLOXACIN IN D5W 750 MG/150ML IV SOLN
750.0000 mg | INTRAVENOUS | Status: DC
  Administered 2013-02-22: 750 mg via INTRAVENOUS
  Filled 2013-02-22: qty 150

## 2013-02-22 MED ORDER — PREDNISONE 20 MG PO TABS
60.0000 mg | ORAL_TABLET | Freq: Once | ORAL | Status: AC
  Administered 2013-02-22: 60 mg via ORAL
  Filled 2013-02-22: qty 3

## 2013-02-22 NOTE — ED Provider Notes (Signed)
History    CSN: [id number] Arrival date & time 02/22/13  0941  First MD Initiated Contact with Patient 02/22/13 1012     Chief Complaint  Patient presents with  . Shortness of Breath   (Consider location/radiation/quality/duration/timing/severity/associated sxs/prior Treatment) HPI Pt with hx of COPD presenting with increased cough and chest congestion.  No fever.  She feels short of breath with coughing and with exertion.  No chest pain.  No fever/chills.  No change in sputum.  She uses 2L O2 at home, has increased to 3L at home.  She was recently on steroid taper and finished this dose- she started having return of symptoms on the day the steroids finished.  No leg swelling, no fever/chills.  She states she sees Dr. Shelle Iron- and has appointment next week for pulmonary function testing.  She also states that she was on spiriva at one time which seemed to help her symptoms but she was only given samples and once it was finished her symptoms worsened again.  There are no other associated systemic symptoms, there are no other alleviating or modifying factors.  Past Medical History  Diagnosis Date  . Thyroid disease   . Hypertension   . Coronary artery disease     Mild on cath 2011  . Hyperlipidemia   . Lung nodule   . Sleep apnea   . GERD (gastroesophageal reflux disease)   . Jaw sarcoma   . Lung cancer 1995  . S/P chemotherapy, time since greater than 12 weeks 1995     [facility] right upper lobe lung  . S/P radiation therapy 1995    right upper lobe lung  . Hx of radiation therapy 2/24, 2/26, 2/28, 3/5, 10/31/12    LLL lung- 60 gray in 5 fx, SBRT   Past Surgical History  Procedure Laterality Date  . Vaginal hysterectomy    . Lung surgery    . Sarcoma      Jaw surgery  . Abdominal hysterectomy    . Lung biopsy     Family History  Problem Relation Age of Onset  . Cancer Mother     mother  . Hypertension Mother   . Heart disease Mother   . Allergic rhinitis  Mother   . Arthritis Mother    History  Substance Use Topics  . Smoking status: Current Every Day Smoker -- 0.75 packs/day for 62 years    Types: Cigarettes, Cigars  . Smokeless tobacco: Not on file     Comment: 1/2-3/4PPD, 01/26/13 2-3 cigs daily  . Alcohol Use: No   OB History   Grav Para Term Preterm Abortions TAB SAB Ect Mult Living                 Review of Systems ROS reviewed and all otherwise negative except for mentioned in HPI  Allergies  Fish allergy and Penicillins  Home Medications   Current Outpatient Rx  Name  Route  Sig  Dispense  Refill  . albuterol (PROVENTIL) (2.5 MG/3ML) 0.083% nebulizer solution   Nebulization   Take 2.5 mg by nebulization every 6 (six) hours as needed for wheezing or shortness of breath.         Marland Kitchen alclomethasone (ACLOVATE) 0.05 % cream   Topical   Apply 1 application topically 2 (two) times daily. Under breast         . aspirin 81 MG tablet   Oral   Take 81 mg by mouth every morning.          Marland Kitchen  atenolol (TENORMIN) 25 MG tablet   Oral   Take 25 mg by mouth every morning.          . budesonide-formoterol (SYMBICORT) 160-4.5 MCG/ACT inhaler   Inhalation   Inhale 2 puffs into the lungs 2 (two) times daily as needed. Shortness of breath         . econazole nitrate 1 % cream   Topical   Apply 1 application topically 2 (two) times daily. Under breast         . furosemide (LASIX) 20 MG tablet   Oral   Take 20 mg by mouth daily as needed for fluid or edema.          Marland Kitchen levothyroxine (SYNTHROID, LEVOTHROID) 100 MCG tablet   Oral   Take 100 mcg by mouth every morning.          Marland Kitchen lisinopril (PRINIVIL,ZESTRIL) 10 MG tablet   Oral   Take 10 mg by mouth 2 (two) times daily.          Marland Kitchen LORazepam (ATIVAN) 1 MG tablet   Oral   Take 1 mg by mouth 2 (two) times daily.          . Multiple Vitamin (MULTIVITAMIN) tablet   Oral   Take 1 tablet by mouth daily.         Marland Kitchen omeprazole (PRILOSEC) 40 MG capsule   Oral    Take 40 mg by mouth every morning.          . Potassium Gluconate 595 MG CAPS   Oral   Take 1 capsule by mouth once a week. Take on Mondays         . rosuvastatin (CRESTOR) 40 MG tablet   Oral   Take 40 mg by mouth every evening.          . vitamin B-12 (CYANOCOBALAMIN) 1000 MCG tablet   Oral   Take 1,000 mcg by mouth every morning.          . Vitamin D, Ergocalciferol, (DRISDOL) 50000 UNITS CAPS   Oral   Take 50,000 Units by mouth every 7 (seven) days. Take on Mondays         . moxifloxacin (AVELOX) 400 MG tablet   Oral   Take 1 tablet (400 mg total) by mouth daily.   19 tablet   0   . predniSONE (DELTASONE) 10 MG tablet   Oral   Take 1 tablet (10 mg total) by mouth daily. Take 4 tablets po qD x 4 days, then 3 tablets po qD x 4 days, then 2 tablets po qD x 4 days, then 1 tablets po qD x 4 days   40 tablet   0    BP 124/62  Pulse 95  Temp(Src) 98.4 F (36.9 C) (Oral)  Resp 25  SpO2 97% Vitals reviewed Physical Exam Physical Examination: General appearance - alert, well appearing, and in no distress Mental status - alert, oriented to person, place, and time Eyes - no conjunctival injection, no scleral icterus Mouth - mucous membranes moist, pharynx normal without lesions Chest - clear to auscultation, no wheezes, rales or rhonchi, symmetric air entry, decreased air movement throughout, [place] in place Heart - normal rate, regular rhythm, normal S1, S2, no murmurs, rubs, clicks or gallops Abdomen - soft, nontender, nondistended, no masses or organomegaly Extremities - peripheral pulses normal, no pedal edema, no clubbing or cyanosis Skin - normal coloration and turgor, no rashes  ED Course  Procedures (including critical  care time)   Date: 02/22/2013  Rate: 90  Rhythm: sinus rhythm  QRS Axis: normal  Intervals: PR prolonged  ST/T Wave abnormalities: normal  Conduction Disutrbances:first-degree A-V block   Narrative Interpretation:   Old EKG Reviewed:  no significant change from prior ekg of 02/06/13  Labs Reviewed  CBC - Abnormal; Notable for the following:    WBC 12.9 (*)    RBC 3.78 (*)    Hemoglobin 11.4 (*)    HCT 34.6 (*)    All other components within normal limits  BASIC METABOLIC PANEL - Abnormal; Notable for the following:    Sodium 131 (*)    Chloride 95 (*)    Glucose, Bld 131 (*)    GFR calc non Af Amer 88 (*)    All other components within normal limits  PRO B NATRIURETIC PEPTIDE  POCT I-STAT TROPONIN I   No results found. 1. COPD exacerbation     MDM  Pt presenting with c/o shortness of breath and cough.  COPD symptoms recurred on the day prednisone taper finished approx 5 days ago.  CXR unchanged, EKG and labs are reassuring.  Pt treated with albuterol treatment in ED and feels improved.  Will start back on slow prednisone taper as well as antibiotics.  She has home oxygen for use.  Encouraged her to f/u with Dr. Shelle Iron.  Doubt PE- she had CT angio of chest 2 weeks ago so will not repeat this testing today.  No signs of CHF, doubt ACS.  Discharged with strict return precautions.  Pt agreeable with plan.    Ethelda Chick, MD 02/22/13 [phone]

## 2013-02-22 NOTE — ED Notes (Signed)
Pt c/o increasing SOB since yesterday morning, chest congestion with nonproductive cough, states gets SOB when coughing. Pt is on home o2 at 2l/min/[place], increased to 3l/min/[place] with no relief

## 2013-02-23 ENCOUNTER — Telehealth (HOSPITAL_COMMUNITY): Payer: Self-pay | Admitting: Emergency Medicine

## 2013-02-27 ENCOUNTER — Encounter: Payer: Self-pay | Admitting: Radiation Oncology

## 2013-02-27 ENCOUNTER — Ambulatory Visit
Admission: RE | Admit: 2013-02-27 | Discharge: 2013-02-27 | Disposition: A | Discharge status: Home / Self Care | Payer: Medicare Other | Source: Ambulatory Visit | Attending: Radiation Oncology | Admitting: Radiation Oncology

## 2013-02-27 NOTE — Progress Notes (Signed)
Portable oxygen therapy 2 liters via nasal cannula noted. Dyspnea noted. Reports upper back pain 4 on a scale of 0-10. Reports constant headache since beginning prednisone. Also, taking Avelox as directed. Productive cough with white sputum mostly but, occasionally tan. Shortness of breath at rest. Denies pain or difficulty swallowing food. Reports that she has no energy at all. Reports heartburn , nausea and vomiting. Denies hearing changes. Reports that a film covers over her eyes occasionally decreasing her visual field. Reports dizziness upon standing. Reports using nebulizer at home every four hours. Wheezing noted upper lung lobes. Crackles heard right lower lobe.

## 2013-02-27 NOTE — Progress Notes (Signed)
Radiation Oncology         (336) [phone] ________________________________  Name: Alexandria Warren MRN: [id number]  Date: 02/27/2013  DOB: 09-05-1933  Follow-Up Visit Note  CC: Alexandria Hoit, MD  Alexandria Perna, MD  Diagnosis:   Clinical stage I non-small cell lung  Interval Since Last Radiation:  4  months  Narrative:  The patient returns today for routine follow-up.  She continues to have problems with her breathing. She did present to the emergency room recently twice for this issue. Patient is now on Avelox as well as prednisone. She continues on supplemental oxygen.  Recent chest x-ray and CT scan of the chest are documented below.                              ALLERGIES:  is allergic to fish allergy and penicillins.  Meds: Current Outpatient Prescriptions  Medication Sig Dispense Refill  . albuterol (PROVENTIL) (2.5 MG/3ML) 0.083% nebulizer solution Take 2.5 mg by nebulization every 6 (six) hours as needed for wheezing or shortness of breath.      Marland Kitchen alclomethasone (ACLOVATE) 0.05 % cream Apply 1 application topically 2 (two) times daily. Under breast      . aspirin 81 MG tablet Take 81 mg by mouth every morning.       Marland Kitchen atenolol (TENORMIN) 25 MG tablet Take 25 mg by mouth every morning.       . budesonide-formoterol (SYMBICORT) 160-4.5 MCG/ACT inhaler Inhale 2 puffs into the lungs 2 (two) times daily as needed. Shortness of breath      . econazole nitrate 1 % cream Apply 1 application topically 2 (two) times daily. Under breast      . furosemide (LASIX) 20 MG tablet Take 20 mg by mouth daily as needed for fluid or edema.       Marland Kitchen levothyroxine (SYNTHROID, LEVOTHROID) 100 MCG tablet Take 100 mcg by mouth every morning.       Marland Kitchen lisinopril (PRINIVIL,ZESTRIL) 10 MG tablet Take 10 mg by mouth 2 (two) times daily.       Marland Kitchen LORazepam (ATIVAN) 1 MG tablet Take 1 mg by mouth 2 (two) times daily.       Marland Kitchen moxifloxacin (AVELOX) 400 MG tablet Take 1 tablet (400 mg total) by mouth daily.  19  tablet  0  . Multiple Vitamin (MULTIVITAMIN) tablet Take 1 tablet by mouth daily.      Marland Kitchen omeprazole (PRILOSEC) 40 MG capsule Take 40 mg by mouth every morning.       . Potassium Gluconate 595 MG CAPS Take 1 capsule by mouth once a week. Take on Mondays      . predniSONE (DELTASONE) 10 MG tablet Take 1 tablet (10 mg total) by mouth daily. Take 4 tablets po qD x 4 days, then 3 tablets po qD x 4 days, then 2 tablets po qD x 4 days, then 1 tablets po qD x 4 days  40 tablet  0  . rosuvastatin (CRESTOR) 40 MG tablet Take 40 mg by mouth every evening.       . vitamin B-12 (CYANOCOBALAMIN) 1000 MCG tablet Take 1,000 mcg by mouth every morning.       . Vitamin D, Ergocalciferol, (DRISDOL) 50000 UNITS CAPS Take 50,000 Units by mouth every 7 (seven) days. Take on Mondays       No current facility-administered medications for this encounter.    Physical Findings: The patient is in no  acute distress. Patient is alert and oriented.  weight is 207 lb (93.895 kg). Her oral temperature is 98.6 F (37 C). Her blood pressure is 151/81 and her pulse is 95. Her respiration is 20 and oxygen saturation is 94%. . No palpable supraclavicular or axillary adenopathy. The lungs reveal some mild bi basilar crackles.  The heart has regular rhythm and rate  Lab Findings: Lab Results  Component Value Date   WBC 12.9* 02/22/2013   HGB 11.4* 02/22/2013   HCT 34.6* 02/22/2013   MCV 91.5 02/22/2013   PLT 268 02/22/2013    @LASTCHEM @  Radiographic Findings: Dg Chest 2 View (if Patient Has Fever And/or Copd)  02/22/2013   *RADIOLOGY REPORT*  Clinical Data: short of breath.  Cough.  History of lung cancer.  CHEST - 2 VIEW  Comparison: 02/06/2013  Findings: Heart size is normal.  There is no pleural effusion or edema.  Postoperative change and volume loss is again noted involving the right lung.  Paramediastinal radiation change is also noted.  No acute airspace consolidation identified.  No pleural effusions or edema.  IMPRESSION:  1.   Stable post therapeutic changes involving the right lung.  No superimposed acute cardiopulmonary abnormality noted.   Original Report Authenticated By: Signa Kell, M.D.   Dg Chest 2 View  02/06/2013   *RADIOLOGY REPORT*  Clinical Data: Cough and shortness of breath today.  History of lung cancer.  Smoker.  CHEST - 2 VIEW  Comparison: 01/11/2013.  Findings: The heart remains normal in size.  Stable right apical pleural thickening and surgical clips and staples.  Stable minimal diffuse peribronchial thickening and accentuation of the interstitial markings.  The left lung remains mildly hyperexpanded. Mild scoliosis.  IMPRESSION: No acute abnormality.  Stable right postoperative changes and mild changes of COPD.   Original Report Authenticated By: Beckie Salts, M.D.   Ct Angio Chest W/cm &/or Wo Cm  02/06/2013   *RADIOLOGY REPORT*  Clinical Data: Shortness of breath and history of lung carcinoma with prior surgery and radiation therapy.  CT ANGIOGRAPHY CHEST  Technique:  Multidetector CT imaging of the chest using the standard protocol during bolus administration of intravenous contrast. Multiplanar reconstructed images including MIPs were obtained and reviewed to evaluate the vascular anatomy.  Contrast: OMNIPAQUE IOHEXOL 350 MG/ML SOLN  Comparison: CT of the chest on 08/26/2012  Findings: The pulmonary arteries are adequately opacified.  There is no evidence of pulmonary embolism.  Stable pleural parenchymal changes and postsurgical changes of the right hemithorax present. Stable nodule in the superior segment of the left lower lobe measures approximately 9 mm.  No acute infiltrate, edema, pneumothorax or pleural effusion is identified.  The heart size is normal.  No pericardial fluid is seen.  Stable atherosclerosis of the thoracic aorta without evidence of thoracic aortic aneurysm.  Bony structures are unremarkable.  Stable osteopenia.  No bony lesions or fractures are identified.  IMPRESSION: No  acute findings.  Stable postsurgical changes of the right hemithorax and left lower lobe lung nodule.   Original Report Authenticated By: Irish Lack, M.D.    Impression:  Breathing continues to be an issue for the patient. She will see her pulmonologist later this week.  Plan:  Routine followup in 4 months. Since patient had a recent chest CT scan, I will not order one for this visit.  _____________________________________  -----------------------------------  Billie Lade, PhD, MD

## 2013-03-03 ENCOUNTER — Ambulatory Visit (INDEPENDENT_AMBULATORY_CARE_PROVIDER_SITE_OTHER): Payer: Medicare Other | Admitting: Pulmonary Disease

## 2013-03-03 ENCOUNTER — Encounter: Payer: Self-pay | Admitting: Pulmonary Disease

## 2013-03-03 VITALS — BP 122/60 | HR 87 | Temp 97.6°F | Ht 68.0 in | Wt 206.5 lb

## 2013-03-03 DIAGNOSIS — R0609 Other forms of dyspnea: Secondary | ICD-10-CM

## 2013-03-03 DIAGNOSIS — F17200 Nicotine dependence, unspecified, uncomplicated: Secondary | ICD-10-CM

## 2013-03-03 DIAGNOSIS — J438 Other emphysema: Secondary | ICD-10-CM

## 2013-03-03 DIAGNOSIS — R0600 Dyspnea, unspecified: Secondary | ICD-10-CM

## 2013-03-03 DIAGNOSIS — J439 Emphysema, unspecified: Secondary | ICD-10-CM

## 2013-03-03 LAB — PULMONARY FUNCTION TEST

## 2013-03-03 MED ORDER — MOMETASONE FURO-FORMOTEROL FUM 100-5 MCG/ACT IN AERO: 2.0000 | INHALATION_SPRAY | Freq: Two times a day (BID) | RESPIRATORY_TRACT | Status: DC

## 2013-03-03 MED ORDER — TIOTROPIUM BROMIDE MONOHYDRATE 18 MCG IN CAPS: 18.0000 ug | ORAL_CAPSULE | Freq: Every day | RESPIRATORY_TRACT | Status: DC

## 2013-03-03 NOTE — Assessment & Plan Note (Signed)
The patient has severe air flow obstruction by her recent PFTs, but also had a significant response to bronchodilators that I suspect is related to airway inflammation from her ongoing smoking.  I suspect that if she was able to quit smoking, that she would see improvement in her air flow.  I think she needs to maintain on a LABA/ICS, as well as a L AMA.  We will try to work with her on medication cost.  I have also asked her to continue with oxygen, and will refer her to the pulmonary rehabilitation program.  Finally, we have had a long discussion about total smoking cessation, and how she will continue to have flareups of her COPD she does not stop.

## 2013-03-03 NOTE — Patient Instructions (Addendum)
Will stop symbicort since you are having a bad reaction.  Start in its place dulera 100/5  2 inhalations am and pm.  Keep mouth rinsed well. Will start back on spiriva one inhalation each am.  Take every morning. Stay on oxygen as directed. Finish up your prednisone and antibiotic. You must quit smoking if you expect to get better and stay well.  Will refer you to pulmonary rehab program at [facility].  Someone will call you and can answer your questions about cough. followup with me in 3mos, but call if you are getting sick again.

## 2013-03-03 NOTE — Progress Notes (Signed)
  Subjective:    Patient ID: Alexandria Warren, female    DOB: 04-21-1934, 79 y.o.   MRN: [id number]  HPI The patient comes in today for followup of her dyspnea on exertion.  She had pulmonary function studies today that showed very severe airflow obstruction, but also a very significant response to bronchodilators.  She was unable to do lung volumes, and her diffusion capacity was severely reduced.  I have reviewed her studies with her in detail, and answered all of her questions.  At the last visit, Spiriva was added to her symbicort, but she had an acute exacerbation which required an ER visit last month.  Unfortunately she continues to smoke.  She was treated with a course of antibiotics and prednisone which she is finishing up at this time.  The patient tells me the Spiriva helped her greatly, and would like to stay on this medication.  She is having issues with the symbicort causing "stopping me up".  She is staying on oxygen as I prescribed at the last visit.   Review of Systems  Constitutional: Negative for fever and unexpected weight change.  HENT: Negative for ear pain, nosebleeds, congestion, sore throat, rhinorrhea, sneezing, trouble swallowing, dental problem, postnasal drip and sinus pressure.   Eyes: Negative for redness and itching.  Respiratory: Negative for cough, chest tightness, shortness of breath and wheezing.   Cardiovascular: Negative for palpitations and leg swelling.  Gastrointestinal: Negative for nausea and vomiting.  Genitourinary: Negative for dysuria.  Musculoskeletal: Negative for joint swelling.  Skin: Negative for rash.  Neurological: Negative for headaches.  Hematological: Does not bruise/bleed easily.  Psychiatric/Behavioral: Negative for dysphoric mood. The patient is not nervous/anxious.        Objective:   Physical Exam Obese female in no acute distress Nose without purulence or discharge noted Neck without lymphadenopathy or thyromegaly Chest with  decreased breath sounds, no active wheezing Cardiac exam is regular rate and rhythm Lower extremities without edema, no cyanosis Alert and oriented, moves all 4 extremities.       Assessment & Plan:

## 2013-03-03 NOTE — Progress Notes (Signed)
PFT done today. 

## 2013-03-03 NOTE — Addendum Note (Signed)
Addended by: Gweneth Dimitri D on: 03/03/2013 12:52 PM   Modules accepted: Orders

## 2013-03-08 ENCOUNTER — Encounter: Payer: Self-pay | Admitting: *Deleted

## 2013-03-13 ENCOUNTER — Telehealth: Payer: Self-pay | Admitting: Pulmonary Disease

## 2013-03-13 NOTE — Telephone Encounter (Signed)
Pt aware.

## 2013-03-29 ENCOUNTER — Other Ambulatory Visit: Payer: Self-pay

## 2013-06-01 ENCOUNTER — Encounter: Payer: Self-pay | Admitting: Radiation Oncology

## 2013-06-01 ENCOUNTER — Ambulatory Visit
Admission: RE | Admit: 2013-06-01 | Discharge: 2013-06-01 | Disposition: A | Discharge status: Home / Self Care | Payer: Medicare Other | Source: Ambulatory Visit | Attending: Radiation Oncology | Admitting: Radiation Oncology

## 2013-06-01 NOTE — Progress Notes (Signed)
Talked with patient today, she was concerned about bills since her discount expired.  I explained that she would not qualify for this discount again since she was insured.  I gave her a Medicaid application since she has never applied.

## 2013-06-01 NOTE — Progress Notes (Signed)
Pt  Denies pain, reports productive cough w/clear sputum, states she "has sinus problems". Pt on continuous O2 @ 3L per concentrator at home, using 2L/min today. She stops O2 when she smokes, current use < 1 PPD. Pt  Reports SOB w/exertion even when on O2.

## 2013-06-01 NOTE — Progress Notes (Signed)
Radiation Oncology         (336) [phone] ________________________________  Name: Alexandria Warren MRN: [id number]  Date: 06/01/2013  DOB: [birth year]  Follow-Up Visit Note  CC: [name], MD  Donata Clay, Theron Arista, MD  Diagnosis:   Clinical stage I non-small cell lung cancer  Interval Since Last Radiation:  7  months, status post SBRT for a left lower lobe nodule  Narrative:  The patient returns today for routine follow-up.  She continues to have problems with her breathing. She was seen this summer by Dr. Junius Argyle and  estimated her life expectancy 3 months. Patient unfortunately continues to smoke. She denies any pain in the chest area or hemoptysis. She has a cough expresses clear sputum. She does feel she has some nasal drainage related to allergies. She is on 3 L of oxygen at home.  She has  chronic headaches. She denies any visual problems.                              ALLERGIES:  is allergic to fish allergy and penicillins.  Meds: Current Outpatient Prescriptions  Medication Sig Dispense Refill  . alclomethasone (ACLOVATE) 0.05 % cream Apply 1 application topically 2 (two) times daily. Under breast      . aspirin 81 MG tablet Take 81 mg by mouth every morning.       Marland Kitchen atenolol (TENORMIN) 25 MG tablet Take 25 mg by mouth every morning.       Marland Kitchen econazole nitrate 1 % cream Apply 1 application topically 2 (two) times daily. Under breast      . furosemide (LASIX) 20 MG tablet Take 20 mg by mouth daily as needed for fluid or edema.       Marland Kitchen levothyroxine (SYNTHROID, LEVOTHROID) 100 MCG tablet Take 100 mcg by mouth every morning.       Marland Kitchen lisinopril (PRINIVIL,ZESTRIL) 10 MG tablet Take 10 mg by mouth 2 (two) times daily.       Marland Kitchen LORazepam (ATIVAN) 1 MG tablet Take 1 mg by mouth 2 (two) times daily.       . mometasone-formoterol (DULERA) 100-5 MCG/ACT AERO Inhale 2 puffs into the lungs 2 (two) times daily.  1 Inhaler  3  . omeprazole (PRILOSEC) 40 MG capsule Take 40 mg by mouth every morning.        . rosuvastatin (CRESTOR) 40 MG tablet Take 40 mg by mouth every evening.       . tiotropium (SPIRIVA) 18 MCG inhalation capsule Place 1 capsule (18 mcg total) into inhaler and inhale daily.  30 capsule  3  . citalopram (CELEXA) 20 MG tablet        No current facility-administered medications for this encounter.    Physical Findings: The patient is in no acute distress. Patient is alert and oriented.  weight is 203 lb 3.2 oz (92.171 kg). Her oral temperature is 98.8 F (37.1 C). Her blood pressure is 149/71 and her pulse is 97. Her respiration is 20 and oxygen saturation is 99%. .  No palpable supraclavicular or axillary adenopathy. The lungs are clear to auscultation. The heart has a regular rhythm and rate.  Supplemental oxygen is in place.  Lab Findings: Lab Results  Component Value Date   WBC 12.9* 02/22/2013   HGB 11.4* 02/22/2013   HCT 34.6* 02/22/2013   MCV 91.5 02/22/2013   PLT 268 02/22/2013      Radiographic Findings: No results found.  Impression:  Clinically stable at this time with limited life expectancy as above related to her COPD. Patient will see Dr. Shelle Iron on October 13. I will hold off on ordering a chest CT scan as he may possibly order  Plan:  Routine followup in 3 months  _____________________________________  -----------------------------------  Billie Lade, PhD, MD

## 2013-06-05 ENCOUNTER — Encounter: Payer: Self-pay | Admitting: Pulmonary Disease

## 2013-06-05 ENCOUNTER — Ambulatory Visit (INDEPENDENT_AMBULATORY_CARE_PROVIDER_SITE_OTHER): Payer: Medicare Other | Admitting: Pulmonary Disease

## 2013-06-05 VITALS — BP 142/96 | HR 77 | Temp 98.5°F | Ht 68.0 in | Wt 203.0 lb

## 2013-06-05 DIAGNOSIS — J439 Emphysema, unspecified: Secondary | ICD-10-CM

## 2013-06-05 DIAGNOSIS — Z23 Encounter for immunization: Secondary | ICD-10-CM

## 2013-06-05 DIAGNOSIS — J9610 Chronic respiratory failure, unspecified whether with hypoxia or hypercapnia: Secondary | ICD-10-CM

## 2013-06-05 DIAGNOSIS — J438 Other emphysema: Secondary | ICD-10-CM

## 2013-06-05 DIAGNOSIS — F17200 Nicotine dependence, unspecified, uncomplicated: Secondary | ICD-10-CM

## 2013-06-05 DIAGNOSIS — Z720 Tobacco use: Secondary | ICD-10-CM

## 2013-06-05 NOTE — Patient Instructions (Signed)
Will give you the flu shot today. Stay on spiriva, dulera, and oxygen Work on some type of exercise program as able. Will start you on a rescue inhaler, proair 2 puffs every 6 hrs if needed for emergencies.  Will also give you a prescription.  followup with me in 4mos, but call if having breathing issues.

## 2013-06-05 NOTE — Progress Notes (Signed)
  Subjective:    Patient ID: Alexandria Warren, female    DOB: [birth year], 79 y.o.   MRN: [id number]  HPI Patient comes in today for followup of her known COPD with chronic respiratory failure.  She is staying on her bronchodilator regimen compliance, as well as her oxygen.  She is trying to stay as active as possible, and has not had an acute exacerbation since the last visit.  She feels that her exertional tolerance is actually improved since being on dulera.   Review of Systems  Constitutional: Negative for fever and unexpected weight change.  HENT: Negative for congestion, dental problem, ear pain, nosebleeds, postnasal drip, rhinorrhea, sinus pressure, sneezing, sore throat and trouble swallowing.   Eyes: Negative for redness and itching.  Respiratory: Positive for cough and shortness of breath. Negative for chest tightness and wheezing.   Cardiovascular: Negative for palpitations and leg swelling.  Gastrointestinal: Negative for nausea and vomiting.  Genitourinary: Negative for dysuria.  Musculoskeletal: Negative for joint swelling.  Skin: Negative for rash.  Neurological: Negative for headaches.  Hematological: Does not bruise/bleed easily.  Psychiatric/Behavioral: Negative for dysphoric mood. The patient is not nervous/anxious.        Objective:   Physical Exam Well-developed female in no acute distress Nose without purulence or discharge noted Neck without lymphadenopathy or thyromegaly Chest with decreased breath sounds, no wheezes Cardiac exam with regular rate and rhythm Lower extremities without edema, cyanosis Alert and oriented, moves all 4 extremities.       Assessment & Plan:

## 2013-06-05 NOTE — Assessment & Plan Note (Signed)
The patient is much improved since being on dulera, and continuing on her maintenance regimen.  I have also had a long conversation with her about total smoking cessation, and she has cut back quite a bit.  I stressed to her that even one or 2 cigarettes a day is too much.  We also discussed various techniques to stop smoking.  She is to continue on her current medications, and work on some type of exercise program.

## 2013-07-06 ENCOUNTER — Other Ambulatory Visit: Payer: Self-pay | Admitting: *Deleted

## 2013-07-06 MED ORDER — MOMETASONE FURO-FORMOTEROL FUM 100-5 MCG/ACT IN AERO: 2.0000 | INHALATION_SPRAY | Freq: Two times a day (BID) | RESPIRATORY_TRACT | Status: DC

## 2013-07-27 ENCOUNTER — Telehealth: Payer: Self-pay | Admitting: Pulmonary Disease

## 2013-07-27 NOTE — Telephone Encounter (Signed)
Pt is aware that we can change her DME. She has an appointment on 12/17, we will do her qualifying sats then for [facility].

## 2013-07-27 NOTE — Telephone Encounter (Signed)
Ok with me to change, but let her know it is difficult sometimes with oxygen. Also, let her know that most of these companies are similar, and may have the same issue with another company.

## 2013-07-27 NOTE — Telephone Encounter (Signed)
Spoke with pt. She is wanting to change her DME from [employer] to [facility]. States that [employer] has been calling her and hassling her about her monthly payment. The pt makes her payment of $24 every month and never misses a payment. Pt states that she will not be treated this way and wants a new company to supply her O2.  KC - please advise on DME change. Thanks.

## 2013-08-03 ENCOUNTER — Ambulatory Visit: Payer: Medicare Other | Admitting: Radiation Oncology

## 2013-08-09 ENCOUNTER — Encounter (HOSPITAL_COMMUNITY): Payer: Medicare Other

## 2013-09-28 ENCOUNTER — Other Ambulatory Visit: Payer: Self-pay | Admitting: *Deleted

## 2013-09-28 MED ORDER — TIOTROPIUM BROMIDE MONOHYDRATE 18 MCG IN CAPS: 18.0000 ug | ORAL_CAPSULE | Freq: Every day | RESPIRATORY_TRACT | Status: AC

## 2013-10-10 ENCOUNTER — Ambulatory Visit: Payer: Medicare Other | Admitting: Pulmonary Disease

## 2013-12-26 ENCOUNTER — Emergency Department (HOSPITAL_COMMUNITY): Payer: Medicare HMO

## 2013-12-26 ENCOUNTER — Emergency Department (HOSPITAL_COMMUNITY)
Admission: EM | Admit: 2013-12-26 | Discharge: 2013-12-26 | Disposition: A | Discharge status: Home / Self Care | Payer: Medicare HMO | Attending: Emergency Medicine | Admitting: Emergency Medicine

## 2013-12-26 ENCOUNTER — Encounter (HOSPITAL_COMMUNITY): Payer: Self-pay | Admitting: Emergency Medicine

## 2013-12-26 DIAGNOSIS — Z7982 Long term (current) use of aspirin: Secondary | ICD-10-CM | POA: Insufficient documentation

## 2013-12-26 DIAGNOSIS — Z85118 Personal history of other malignant neoplasm of bronchus and lung: Secondary | ICD-10-CM | POA: Insufficient documentation

## 2013-12-26 DIAGNOSIS — M8448XA Pathological fracture, other site, initial encounter for fracture: Secondary | ICD-10-CM | POA: Insufficient documentation

## 2013-12-26 DIAGNOSIS — Z923 Personal history of irradiation: Secondary | ICD-10-CM | POA: Insufficient documentation

## 2013-12-26 DIAGNOSIS — F17200 Nicotine dependence, unspecified, uncomplicated: Secondary | ICD-10-CM | POA: Insufficient documentation

## 2013-12-26 DIAGNOSIS — I2510 Atherosclerotic heart disease of native coronary artery without angina pectoris: Secondary | ICD-10-CM | POA: Insufficient documentation

## 2013-12-26 DIAGNOSIS — I10 Essential (primary) hypertension: Secondary | ICD-10-CM | POA: Insufficient documentation

## 2013-12-26 DIAGNOSIS — IMO0002 Reserved for concepts with insufficient information to code with codable children: Secondary | ICD-10-CM | POA: Insufficient documentation

## 2013-12-26 DIAGNOSIS — K219 Gastro-esophageal reflux disease without esophagitis: Secondary | ICD-10-CM | POA: Insufficient documentation

## 2013-12-26 DIAGNOSIS — E785 Hyperlipidemia, unspecified: Secondary | ICD-10-CM | POA: Insufficient documentation

## 2013-12-26 DIAGNOSIS — E079 Disorder of thyroid, unspecified: Secondary | ICD-10-CM | POA: Insufficient documentation

## 2013-12-26 DIAGNOSIS — Z880 Allergy status to penicillin: Secondary | ICD-10-CM | POA: Insufficient documentation

## 2013-12-26 DIAGNOSIS — M4850XA Collapsed vertebra, not elsewhere classified, site unspecified, initial encounter for fracture: Secondary | ICD-10-CM

## 2013-12-26 DIAGNOSIS — Z85830 Personal history of malignant neoplasm of bone: Secondary | ICD-10-CM | POA: Insufficient documentation

## 2013-12-26 DIAGNOSIS — Z7901 Long term (current) use of anticoagulants: Secondary | ICD-10-CM | POA: Insufficient documentation

## 2013-12-26 DIAGNOSIS — Z9981 Dependence on supplemental oxygen: Secondary | ICD-10-CM | POA: Insufficient documentation

## 2013-12-26 DIAGNOSIS — Z5189 Encounter for other specified aftercare: Secondary | ICD-10-CM | POA: Insufficient documentation

## 2013-12-26 HISTORY — DX: Chronic obstructive pulmonary disease, unspecified: J44.9

## 2013-12-26 LAB — CBC
HEMATOCRIT: 36.5 % (ref 36.0–46.0)
HEMOGLOBIN: 11.8 g/dL — AB (ref 12.0–15.0)
MCH: 29.6 pg (ref 26.0–34.0)
MCHC: 32.3 g/dL (ref 30.0–36.0)
MCV: 91.5 fL (ref 78.0–100.0)
Platelets: 370 10*3/uL (ref 150–400)
RBC: 3.99 MIL/uL (ref 3.87–5.11)
RDW: 13.3 % (ref 11.5–15.5)
WBC: 8.2 10*3/uL (ref 4.0–10.5)

## 2013-12-26 LAB — BASIC METABOLIC PANEL
BUN: 10 mg/dL (ref 6–23)
CHLORIDE: 96 meq/L (ref 96–112)
CO2: 31 mEq/L (ref 19–32)
Calcium: 9.8 mg/dL (ref 8.4–10.5)
Creatinine, Ser: 0.59 mg/dL (ref 0.50–1.10)
GFR, EST NON AFRICAN AMERICAN: 86 mL/min — AB (ref >90)
GLUCOSE: 136 mg/dL — AB (ref 70–99)
POTASSIUM: 4.2 meq/L (ref 3.7–5.3)
SODIUM: 138 meq/L (ref 137–147)

## 2013-12-26 MED ORDER — IPRATROPIUM-ALBUTEROL 0.5-2.5 (3) MG/3ML IN SOLN
3.0000 mL | RESPIRATORY_TRACT | Status: DC | PRN
  Administered 2013-12-26: 3 mL via RESPIRATORY_TRACT
  Filled 2013-12-26: qty 3

## 2013-12-26 MED ORDER — SODIUM CHLORIDE 0.9 % IV BOLUS (SEPSIS)
500.0000 mL | Freq: Once | INTRAVENOUS | Status: AC
  Administered 2013-12-26: 500 mL via INTRAVENOUS

## 2013-12-26 MED ORDER — IOHEXOL 350 MG/ML SOLN
100.0000 mL | Freq: Once | INTRAVENOUS | Status: AC | PRN
  Administered 2013-12-26: 100 mL via INTRAVENOUS

## 2013-12-26 MED ORDER — HYDROCODONE-ACETAMINOPHEN 5-325 MG PO TABS: 1.0000 | ORAL_TABLET | Freq: Four times a day (QID) | ORAL | Status: DC | PRN

## 2013-12-26 MED ORDER — MORPHINE SULFATE 2 MG/ML IJ SOLN
2.0000 mg | INTRAMUSCULAR | Status: DC | PRN
  Administered 2013-12-26 (×2): 2 mg via INTRAVENOUS
  Filled 2013-12-26 (×2): qty 1

## 2013-12-26 MED ORDER — ALPRAZOLAM 0.5 MG PO TABS
1.0000 mg | ORAL_TABLET | Freq: Once | ORAL | Status: AC
Start: 2013-12-26 — End: 2013-12-26
  Administered 2013-12-26: 1 mg via ORAL
  Filled 2013-12-26: qty 2

## 2013-12-26 MED ORDER — SODIUM CHLORIDE 0.9 % IV BOLUS (SEPSIS)
500.0000 mL | Freq: Once | INTRAVENOUS | Status: AC
  Administered 2013-12-26: 11:00:00 via INTRAVENOUS

## 2013-12-26 NOTE — ED Provider Notes (Signed)
I saw and evaluated the patient, reviewed the resident's note and I agree with the findings and plan.   EKG Interpretation   Date/Time:  Tuesday Dec 26 2013 08:08:07 EDT Ventricular Rate:  103 PR Interval:  227 QRS Duration: 94 QT Interval:  350 QTC Calculation: 458 R Axis:   44 Text Interpretation:  Sinus tachycardia Prolonged PR interval Anteroseptal  infarct, age indeterminate No significant change since last tracing  Confirmed by [facility]  MD, MARTHA [phone]) on 12/26/2013 8:11:13 AM     ROS reviewed and all otherwise negative except for mentioned in HPI  Pt seen and evaluated, mild expiratory wheezing, point tenderness over T6 area where fracture found on CT scan.  Pt feels improved after morphine in the ED.  She will need followup for outpatient MRI with her primary doctor, she verbalizes understanding of this.  Discharged with strict return precautions.  Pt agreeable with plan.  Threasa Beards, MD 12/26/13 [phone]

## 2013-12-26 NOTE — ED Notes (Addendum)
Per patient, she woke up yesterday morning short of breath. Pt reports she began hurting on the lower right side of her back around 1100 yesterday morning. She took tylenol at home but was unable to completely get rid of the pain. Pt reports she has had a dry cough at home. Pt denies N/V/D.

## 2013-12-26 NOTE — ED Provider Notes (Signed)
CSN: [id number]     Arrival date & time 12/26/13  3474 History   First MD Initiated Contact with Patient 12/26/13 [phone]     Chief Complaint  Patient presents with  . Shortness of Breath    (Consider location/radiation/quality/duration/timing/severity/associated sxs/prior Treatment)  HPI  Devone Bonilla is a 80 y.o. F PMH severe COPD with chronic respiratory failure on home O2 3L [place] (sees Dr. Gwenette Greet), tobacco abuse, clinical stage I non-small cell lung cancer status post SBRT in 3/14 who presents with right-sided thoracic back pain.  Patient reports the pain started yesterday morning in "her right lung." When asked to clarify, she says the pain is on her back on the right side. It is worse with deep inspiration and movement. It sometimes feels better when she leans forwards, but it always starts up again. She thought it was just a normal pain and went about her day, but the severity of the pain increased overnight to 10/10. She took Tylenol with some relief and came to the ED.  No recent trauma, new activities, heavy lifting, falls. She did reports a coughing spell on Friday that was significant.  She has a history of lung cancer and the nodule is in superior segment of her left lower lobe. She has not been able to follow up with oncology due to insurance issues. Last appointment was 10/14, she was meant to follow up in 3 months. Her last chest CT was in 01/2013 and showed a stable nodule.  Denies change from baseline SOB, cough, chest pain, abdominal pain, nausea, vomiting.   Past Medical History  Diagnosis Date  . Thyroid disease   . Hypertension   . Coronary artery disease     Mild on cath 2011  . Hyperlipidemia   . Lung nodule   . Sleep apnea   . GERD (gastroesophageal reflux disease)   . Jaw sarcoma   . Lung cancer 1995  . S/P chemotherapy, time since greater than 12 weeks 1995     Chemoradiation at [facility] right upper lobe lung  . S/P radiation therapy 1995    right upper lobe lung   . Hx of radiation therapy 2/24, 2/26, 2/28, 3/5, 10/31/12    LLL lung- 60 gray in 5 fx, SBRT  . COPD (chronic obstructive pulmonary disease)    Past Surgical History  Procedure Laterality Date  . Vaginal hysterectomy    . Lung surgery    . Sarcoma      Jaw surgery  . Abdominal hysterectomy    . Lung biopsy     Family History  Problem Relation Age of Onset  . Cancer Mother     mother  . Hypertension Mother   . Heart disease Mother   . Allergic rhinitis Mother   . Arthritis Mother    History  Substance Use Topics  . Smoking status: Current Every Day Smoker -- 1.00 packs/day for 62 years    Types: Cigarettes, Cigars  . Smokeless tobacco: Not on file     Comment: 1/2-3/4PPD, 01/26/13 2-3 cigs daily 06/01/13 currently < 1 PPD  . Alcohol Use: No   OB History   Grav Para Term Preterm Abortions TAB SAB Ect Mult Living                 Review of Systems  Constitutional: Negative for fever and chills.  Respiratory: Negative for cough, shortness of breath and wheezing.   Cardiovascular: Negative for chest pain.  Gastrointestinal: Negative for nausea, vomiting and abdominal  pain.  Musculoskeletal: Positive for back pain.  Neurological: Negative for dizziness and weakness.    Allergies  Fish allergy and Penicillins  Home Medications   Prior to Admission medications   Medication Sig Start Date End Date Taking? Authorizing Provider  alclomethasone (ACLOVATE) 0.05 % cream Apply 1 application topically 2 (two) times daily. Under breast 11/01/12   Historical Provider, MD  aspirin 81 MG tablet Take 81 mg by mouth every morning.     Historical Provider, MD  atenolol (TENORMIN) 25 MG tablet Take 25 mg by mouth every morning.  06/14/12   Historical Provider, MD  citalopram (CELEXA) 20 MG tablet  03/07/13   Historical Provider, MD  econazole nitrate 1 % cream Apply 1 application topically 2 (two) times daily. Under breast 11/01/12   Historical Provider, MD  furosemide (LASIX) 20 MG tablet  Take 20 mg by mouth daily as needed for fluid or edema.  05/27/12   Historical Provider, MD  levothyroxine (SYNTHROID, LEVOTHROID) 100 MCG tablet Take 100 mcg by mouth every morning.     Historical Provider, MD  lisinopril (PRINIVIL,ZESTRIL) 10 MG tablet Take 10 mg by mouth 2 (two) times daily.  05/03/12   Historical Provider, MD  LORazepam (ATIVAN) 1 MG tablet Take 1 mg by mouth 2 (two) times daily.     Historical Provider, MD  mometasone-formoterol (DULERA) 100-5 MCG/ACT AERO Inhale 2 puffs into the lungs 2 (two) times daily. 07/06/13   Kathee Delton, MD  omeprazole (PRILOSEC) 40 MG capsule Take 40 mg by mouth every morning.     Historical Provider, MD  rosuvastatin (CRESTOR) 40 MG tablet Take 40 mg by mouth every evening.     Historical Provider, MD  tiotropium (SPIRIVA) 18 MCG inhalation capsule Place 1 capsule (18 mcg total) into inhaler and inhale daily. 09/28/13   Kathee Delton, MD   BP 150/71  Pulse 101  Temp(Src) 97.4 F (36.3 C) (Oral)  Resp 18  Ht 5\' 9"  (1.753 m)  Wt 200 lb (90.719 kg)  BMI 29.52 kg/m2  SpO2 97% on 2L [place] Physical Exam  Constitutional: She is oriented to person, place, and time. She appears well-developed and well-nourished.  Pleasant, speaking in full sentences  HENT:  Head: Normocephalic and atraumatic.  Eyes: Conjunctivae and EOM are normal. Pupils are equal, round, and reactive to light.  Neck: Normal range of motion. Neck supple.  Cardiovascular: Normal rate, regular rhythm and normal heart sounds.  Exam reveals no gallop and no friction rub.   No murmur heard. Pulmonary/Chest: Effort normal. No respiratory distress. She has no wheezes. She has no rales. She exhibits no tenderness.  Soft crackles at bases, moving air well but volume is reduced  Musculoskeletal:       Thoracic back: She exhibits tenderness (Tenderness to gentle palpation of right middle thoracic back, near paraspinal area, pain even with stethoscope placement). She exhibits no swelling, no  edema and no deformity.  Neurological: She is alert and oriented to person, place, and time. No cranial nerve deficit.  Skin: Skin is warm and dry.  Psychiatric: She has a normal mood and affect.    ED Course  Procedures (including critical care time) Labs Review Labs Reviewed  BASIC METABOLIC PANEL - Abnormal; Notable for the following:    Glucose, Bld 136 (*)    GFR calc non Af Amer 86 (*)    All other components within normal limits  CBC - Abnormal; Notable for the following:    Hemoglobin 11.8 (*)  All other components within normal limits    Imaging Review Dg Chest 2 View  12/26/2013   CLINICAL DATA:  80 year old female with shortness of breath. History of lung cancer.  EXAM: CHEST  2 VIEW  COMPARISON:  02/22/2013 and prior chest radiographs  FINDINGS: The cardiomediastinal silhouette is stable with postsurgical changes within the right mediastinum and upper right hemithorax.  There is no evidence of focal airspace disease, pulmonary edema, suspicious pulmonary nodule/mass, pleural effusion, or pneumothorax. No acute bony abnormalities are identified.  IMPRESSION: No active cardiopulmonary disease.   Electronically Signed   By: Hassan Rowan M.D.   On: 12/26/2013 09:22   Ct Angio Chest W/cm &/or Wo Cm  12/26/2013   CLINICAL DATA:  Short of breath, back pain. History of partial right lung resection. Concern for pulmonary embolism. Additional history of left lung cancer diagnosed and 2014.  EXAM: CT ANGIOGRAPHY CHEST WITH CONTRAST  TECHNIQUE: Multidetector CT imaging of the chest was performed using the standard protocol during bolus administration of intravenous contrast. Multiplanar CT image reconstructions and MIPs were obtained to evaluate the vascular anatomy.  CONTRAST:  186mL OMNIPAQUE IOHEXOL 350 MG/ML SOLN  COMPARISON:  CT 02/06/2013  FINDINGS: There are no filling defects within the pulmonary arteries to suggest acute pulmonary embolism. There is postsurgical change consistent with  right upper lobectomy. No acute findings of the aorta great vessels. No pericardial fluid.  There is consolidation in the medial aspect of the upper right hemi thorax unchanged compared to prior. No new nodularity is evident. Mild ground-glass opacity in the superior segment of the left lower lobe (image 23 from series 9) at site of prior probably nodule  There is no axillary or supraclavicular lymphadenopathy. No mediastinal adenopathy.  Review of the upper abdomen demonstrates a nodular left adrenal gland similar prior. The entirety adrenal glands not imaged.  There is a new mild compression deformity of the superior and inferior endplates of the T6 vertebral body (image 78, series 13 and image 113, series 10). No significant loss of vertebral body height. This is concerning for either posttraumatic or pathologic fracture. There is mild sclerosis within the vertebral body.  Review of the MIP images confirms the above findings.  IMPRESSION: 1. New compression deformity at T6 vertebral body represents a posttraumatic or potentially pathologic fracture. Consider MRI of the lumbar spine for further evaluation. 2. No evidence acute pulmonary embolism. 3. Postsurgical change consistent with right upper lobectomy. 4. Consolidation in the right lung apex is unchanged from prior likely represents a combination of radiation and postsurgical change. 5. Ground-glass opacity at site of prior left lower lobe nodule. 6.   Electronically Signed   By: Suzy Bouchard M.D.   On: 12/26/2013 11:52     EKG Interpretation   Date/Time:  Tuesday Dec 26 2013 08:08:07 EDT Ventricular Rate:  103 PR Interval:  227 QRS Duration: 94 QT Interval:  350 QTC Calculation: 458 R Axis:   44 Text Interpretation:  Sinus tachycardia Prolonged PR interval Anteroseptal  infarct, age indeterminate No significant change since last tracing  Confirmed by [facility]  MD, MARTHA [phone]) on 12/26/2013 8:11:13 AM      MDM   Final diagnoses:   Vertebral compression fracture   Abri Vacca is a 80 y.o. F PMH severe COPD with chronic respiratory failure on home O2 3L [place] (sees Dr. Gwenette Greet), tobacco abuse, clinical stage I non-small cell lung cancer status post SBRT in 3/14 who presents with right-sided thoracic back pain. Describes pain  as pleuritic. Exam is notable for tenderness to palpation on right lower thoracic back. She has mild tachycardia and a history of malignancy, concerning for PE. We will obtain CTA and CXR. This will also help [place] rule out pneumonia. Exam has features of musculoskeletal pain but this would be the diagnosis of exclusion. Ordered BMP and CBC. Pain management with morphine.  She denies SOB or increased sputum production. I do not think this is a COPD exacerbation.   CXR is clear. EKG unchanged from prior. Sinus tachycardia, rate 103.  10:50AM Cr is wnl at 0.59. Will proceed with CTA. Treating with 500cc bolus before and after scan to reduce risk of AKI. Will give 1 dose of home Xanax as the patient reports claustrophobia.  12:00PM Gave DuoNeb as patient started complaining of wheezing. Symptoms improved.  1:50PM CTA was negative for pulmonary embolism but positive for vertebral compression fracture at T6. Back was reexamined, and area of pain correlates with this fracture. As patient is having no current neurologic problems, she will be discharged with plans for an outpatient MRI to determine the cause (pathologic/malignant vs. Traumatic). She was given parameters to return if she develops weakness, numbness, loss of bowel or bladder control. She will followup with her PCP and radiation oncologist. Pain well-controlled with narcotics, she was given a prescription of Norco for home.  Lesly Dukes, MD 12/26/13 [phone]

## 2013-12-26 NOTE — Discharge Instructions (Signed)
Thanks for your visit. - Please call and schedule an appointment to followup with your PCP this week. It is very important that you ask him or her to order an outpatient MRI of your thoracic spine. This will help determine the cause of your fracture. - Please followup with your cancer doctor and your lung doctor in the next few weeks. - If you develop weakness in your legs or arms, numbness, loss of bowel or bladder control please return to the ED immediately. - I given you some pain medicine. Your pain should improve with time. Please discuss any refills with your PCP as we cannot provide long term prescriptions for narcotic pain medications.

## 2013-12-26 NOTE — ED Notes (Addendum)
Patient with Hx of COPD, lung cancer, c/o mid, right, concentrated lateral back pain, states it is "lung pain." Patient also c/o bilateral pedal edema x 5 days, last radiation Tx was 6 months ago.

## 2013-12-28 ENCOUNTER — Telehealth: Payer: Self-pay | Admitting: *Deleted

## 2013-12-28 ENCOUNTER — Other Ambulatory Visit: Payer: Self-pay | Admitting: Family Medicine

## 2013-12-28 DIAGNOSIS — M8580 Other specified disorders of bone density and structure, unspecified site: Secondary | ICD-10-CM

## 2013-12-28 DIAGNOSIS — M545 Low back pain, unspecified: Secondary | ICD-10-CM

## 2013-12-28 NOTE — Telephone Encounter (Signed)
Alexandria Warren from [phone] Dr.office has [name] there and she would like a follow up appt with Dr.Kinard, she hasn't been seen for insurance problems, please call patient at [phone] and then call Alexandria Warren back with confirmation date and time, will forward to [name] who is in a staff meeting the full hour, Alexandria Warren thanked me and is waiting call back 2:11 PM

## 2013-12-29 ENCOUNTER — Ambulatory Visit (INDEPENDENT_AMBULATORY_CARE_PROVIDER_SITE_OTHER): Payer: Medicare HMO | Admitting: Pulmonary Disease

## 2013-12-29 ENCOUNTER — Ambulatory Visit
Admission: RE | Admit: 2013-12-29 | Discharge: 2013-12-29 | Disposition: A | Discharge status: Home / Self Care | Payer: Medicare HMO | Source: Ambulatory Visit | Attending: Family Medicine | Admitting: Family Medicine

## 2013-12-29 ENCOUNTER — Encounter: Payer: Self-pay | Admitting: Pulmonary Disease

## 2013-12-29 VITALS — BP 130/70 | HR 104 | Temp 98.2°F | Ht 69.0 in | Wt 202.8 lb

## 2013-12-29 DIAGNOSIS — J9610 Chronic respiratory failure, unspecified whether with hypoxia or hypercapnia: Secondary | ICD-10-CM

## 2013-12-29 DIAGNOSIS — M545 Low back pain, unspecified: Secondary | ICD-10-CM

## 2013-12-29 DIAGNOSIS — J439 Emphysema, unspecified: Secondary | ICD-10-CM

## 2013-12-29 DIAGNOSIS — J438 Other emphysema: Secondary | ICD-10-CM

## 2013-12-29 NOTE — Progress Notes (Signed)
   Subjective:    Patient ID: Alexandria Warren, female    DOB: 1934-08-16, 80 y.o.   MRN: [id number]  HPI The patient comes in today for followup of her COPD with chronic respiratory failure. She is continuing on her excellent bronchodilator regimen, but unfortunately continues to smoke. She has not had a recent pulmonary infection or acute exacerbation. She continues to have significant dyspnea on exertion, but is also very deconditioned.   Review of Systems  Constitutional: Negative for fever and unexpected weight change.  HENT: Negative for congestion, dental problem, ear pain, nosebleeds, postnasal drip, rhinorrhea, sinus pressure, sneezing, sore throat and trouble swallowing.   Eyes: Negative for redness and itching.  Respiratory: Positive for cough and shortness of breath. Negative for chest tightness and wheezing.   Cardiovascular: Negative for palpitations and leg swelling.  Gastrointestinal: Negative for nausea and vomiting.  Genitourinary: Negative for dysuria.  Musculoskeletal: Negative for joint swelling.  Skin: Negative for rash.  Neurological: Negative for headaches.  Hematological: Does not bruise/bleed easily.  Psychiatric/Behavioral: Negative for dysphoric mood. The patient is not nervous/anxious.        Objective:   Physical Exam Overweight female in no acute distress Nose without purulence or discharge noted Neck without lymphadenopathy or thyromegaly Chest with scattered rhonchi, a few crackles in her upper lung zones that her chronic, no actual wheezing. Cardiac exam with regular rate and rhythm Lower extremities with 1+ edema, no cyanosis Alert and oriented, moves all 4 extremities.       Assessment & Plan:

## 2013-12-29 NOTE — Assessment & Plan Note (Signed)
The patient feels that she is near her usual chronic baseline, but unfortunately continues to smoke. I've asked her to stay on her current bronchodilator regimen, but expressed to her my concern with her ongoing smoking. She will unlikely improve without total smoking cessation, and will be at risk for recurrent exacerbations. I also stressed to her the importance of staying on her oxygen, and to work on strengthening.

## 2013-12-29 NOTE — Patient Instructions (Signed)
Continue with your dulera and spiriva Will send in a prescription for proair for you for rescue Work on total smoking cessation.  This is the key to getting better.  followup with me again in 76mos.

## 2014-01-03 ENCOUNTER — Ambulatory Visit
Admission: RE | Admit: 2014-01-03 | Discharge: 2014-01-03 | Disposition: A | Discharge status: Home / Self Care | Payer: Medicare HMO | Source: Ambulatory Visit | Attending: Family Medicine | Admitting: Family Medicine

## 2014-01-03 DIAGNOSIS — M8580 Other specified disorders of bone density and structure, unspecified site: Secondary | ICD-10-CM

## 2014-01-10 ENCOUNTER — Ambulatory Visit
Admission: RE | Admit: 2014-01-10 | Discharge: 2014-01-10 | Disposition: A | Discharge status: Home / Self Care | Payer: Medicare HMO | Source: Ambulatory Visit | Attending: Radiation Oncology | Admitting: Radiation Oncology

## 2014-01-10 ENCOUNTER — Telehealth: Payer: Self-pay | Admitting: *Deleted

## 2014-01-10 ENCOUNTER — Ambulatory Visit (HOSPITAL_COMMUNITY): Payer: Medicare HMO

## 2014-01-10 ENCOUNTER — Encounter: Payer: Self-pay | Admitting: Radiation Oncology

## 2014-01-10 VITALS — BP 168/84 | HR 93 | Temp 98.4°F | Ht 69.0 in | Wt 202.0 lb

## 2014-01-10 DIAGNOSIS — C3430 Malignant neoplasm of lower lobe, unspecified bronchus or lung: Secondary | ICD-10-CM

## 2014-01-10 NOTE — Progress Notes (Addendum)
Alexandria Warren here for follow up after SBRT to her left lower lung.  She is having pain due to compression fracture in her back.  She is in a wheelchair today.  She is rating the pain at a 9/10.  She is taking 1 norco q 4 hours prn.  She reports a frequent dry cough.  She denies hemoptysis.  She reports shortness of breath with humid weather.  She is on 2l of oxgy[place] via nasal canula today.  Her oxygen saturation is 94%.  Her bp was elevated today at 181/93 and 168/84 when rechecked.

## 2014-01-10 NOTE — Telephone Encounter (Signed)
Called patient to inform that MRI has been moved to 01-18-14 - arrival time - 7:45 am @ [facility], spoke with patient and she is aware of this test

## 2014-01-10 NOTE — Progress Notes (Signed)
Radiation Oncology         [phone] ________________________________  Name: Alexandria Warren MRN: [id number]  Date: 01/10/2014  DOB: 04-29-1934  Follow-Up Visit Note  CC: [name], MD  [name], MD  Diagnosis:  clinical stage I non-small cell lung cancer   Interval Since Last Radiation:  One year and 2 months, the patient was treated with SBRT directed to her left lower lobe nodule  Narrative:  The patient returns today for routine follow-up.  Interval history significant for the patient developing severe upper back pain. She presented to the emergency room.  A chest CT scan was performed which showed the lung areas to be stable as documented below however the patient was noted to have a new compression fracture at T6.  MRI of the lumbar spine was ordered which showed no metastasis to this region.  Patient continues to have significant pain in her upper back region she rates as a 10 out of 10. She takes hydrocodone for this issue.  She denies any bowel or bladder incontinence her new weakness in her lower extremities.  Her breathing has been stable. She is on 2-3 L of oxygen 24 hours a day. She unfortunately continues to smoke a half-pack per day                            ALLERGIES:  is allergic to fish allergy and penicillins.  Meds: Current Outpatient Prescriptions  Medication Sig Dispense Refill  . acetaminophen (TYLENOL) 500 MG tablet Take 1,000 mg by mouth every 6 (six) hours as needed.      Marland Kitchen alclomethasone (ACLOVATE) 0.05 % cream Apply 1 application topically 2 (two) times daily. Under breast      . ALPRAZolam (XANAX) 1 MG tablet Take 1 mg by mouth 2 (two) times daily as needed for anxiety.      Marland Kitchen aspirin 81 MG tablet Take 81 mg by mouth every morning.       Marland Kitchen atenolol (TENORMIN) 25 MG tablet Take 25 mg by mouth every morning.       . citalopram (CELEXA) 20 MG tablet Take 20 mg by mouth daily.       Marland Kitchen docusate sodium (COLACE) 100 MG capsule Take 100 mg by mouth 2  (two) times daily.      Marland Kitchen econazole nitrate 1 % cream Apply 1 application topically 2 (two) times daily. Under breast      . furosemide (LASIX) 20 MG tablet Take 20 mg by mouth daily as needed for fluid or edema.       Marland Kitchen HYDROcodone-acetaminophen (NORCO) 5-325 MG per tablet Take 1-2 tablets by mouth every 6 (six) hours as needed for moderate pain.  30 tablet  0  . levothyroxine (SYNTHROID, LEVOTHROID) 100 MCG tablet Take 100 mcg by mouth daily before breakfast.       . lisinopril (PRINIVIL,ZESTRIL) 10 MG tablet Take 10 mg by mouth 2 (two) times daily.       Marland Kitchen LORazepam (ATIVAN) 1 MG tablet Take 0.5-1 mg by mouth 2 (two) times daily as needed for anxiety.       . Melatonin 3 MG TABS Take 1 tablet by mouth at bedtime.      . mometasone-formoterol (DULERA) 100-5 MCG/ACT AERO Inhale 2 puffs into the lungs 2 (two) times daily.  1 Inhaler  3  . omeprazole (PRILOSEC) 40 MG capsule Take 40 mg by mouth every morning.       Marland Kitchen  rosuvastatin (CRESTOR) 40 MG tablet Take 40 mg by mouth every evening.       . tiotropium (SPIRIVA) 18 MCG inhalation capsule Place 1 capsule (18 mcg total) into inhaler and inhale daily.  30 capsule  6   No current facility-administered medications for this encounter.    Physical Findings: The patient is in no acute distress. Patient is alert and oriented.  height is 5\' 9"  (1.753 m) and weight is 202 lb (91.627 kg). Her oral temperature is 98.4 F (36.9 C). Her blood pressure is 168/84 and her pulse is 93. Her oxygen saturation is 94%. .  No palpable supraclavicular or axillary adenopathy. The lungs are clear. The heart has regular rhythm and rate. Patient has supplemental oxygen in place. Lower motor strength appears to be 5 out of 5. Patient has point tenderness with palpation in the mid thoracic spine area.  Lab Findings: Lab Results  Component Value Date   WBC 8.2 12/26/2013   HGB 11.8* 12/26/2013   HCT 36.5 12/26/2013   MCV 91.5 12/26/2013   PLT 370 12/26/2013     Radiographic  Findings: Dg Chest 2 View  12/26/2013   CLINICAL DATA:  80 year old female with shortness of breath. History of lung cancer.  EXAM: CHEST  2 VIEW  COMPARISON:  02/22/2013 and prior chest radiographs  FINDINGS: The cardiomediastinal silhouette is stable with postsurgical changes within the right mediastinum and upper right hemithorax.  There is no evidence of focal airspace disease, pulmonary edema, suspicious pulmonary nodule/mass, pleural effusion, or pneumothorax. No acute bony abnormalities are identified.  IMPRESSION: No active cardiopulmonary disease.   Electronically Signed   By: Hassan Rowan M.D.   On: 12/26/2013 09:22   Ct Angio Chest W/cm &/or Wo Cm  12/26/2013   CLINICAL DATA:  Short of breath, back pain. History of partial right lung resection. Concern for pulmonary embolism. Additional history of left lung cancer diagnosed and 2014.  EXAM: CT ANGIOGRAPHY CHEST WITH CONTRAST  TECHNIQUE: Multidetector CT imaging of the chest was performed using the standard protocol during bolus administration of intravenous contrast. Multiplanar CT image reconstructions and MIPs were obtained to evaluate the vascular anatomy.  CONTRAST:  130mL OMNIPAQUE IOHEXOL 350 MG/ML SOLN  COMPARISON:  CT 02/06/2013  FINDINGS: There are no filling defects within the pulmonary arteries to suggest acute pulmonary embolism. There is postsurgical change consistent with right upper lobectomy. No acute findings of the aorta great vessels. No pericardial fluid.  There is consolidation in the medial aspect of the upper right hemi thorax unchanged compared to prior. No new nodularity is evident. Mild ground-glass opacity in the superior segment of the left lower lobe (image 23 from series 9) at site of prior probably nodule  There is no axillary or supraclavicular lymphadenopathy. No mediastinal adenopathy.  Review of the upper abdomen demonstrates a nodular left adrenal gland similar prior. The entirety adrenal glands not imaged.  There is a  new mild compression deformity of the superior and inferior endplates of the T6 vertebral body (image 78, series 13 and image 113, series 10). No significant loss of vertebral body height. This is concerning for either posttraumatic or pathologic fracture. There is mild sclerosis within the vertebral body.  Review of the MIP images confirms the above findings.  IMPRESSION: 1. New compression deformity at T6 vertebral body represents a posttraumatic or potentially pathologic fracture. Consider MRI of the lumbar spine for further evaluation. 2. No evidence acute pulmonary embolism. 3. Postsurgical change consistent with right upper  lobectomy. 4. Consolidation in the right lung apex is unchanged from prior likely represents a combination of radiation and postsurgical change. 5. Ground-glass opacity at site of prior left lower lobe nodule. 6.   Electronically Signed   By: Suzy Bouchard M.D.   On: 12/26/2013 11:52   Mr Lumbar Spine Wo Contrast  12/29/2013   CLINICAL DATA:  Severe low back pain for 1 week.  EXAM: MRI LUMBAR SPINE WITHOUT CONTRAST  TECHNIQUE: Multiplanar, multisequence MR imaging of the lumbar spine was performed. No intravenous contrast was administered.  COMPARISON:  None.  FINDINGS: Normal signal is present in the conus medullaris which terminates at L2-3, the lower limits of normal. A transitional S1 segment is noted. There is diffuse fatty infiltration of the marrow. No focal lesions are present. Vertebral body heights and alignment are maintained. Leftward curvature of the lumbar spine is centered at L3-4.  Limited imaging of the abdomen is unremarkable.  L1-2:  Negative.  L2-3:  Negative.  L3-4: Mild facet hypertrophy is worse on the right. Mild lateral disc bulging is present bilaterally. There is no significant stenosis.  L4-5:  Negative.  L5-S1: Minimal leftward disc bulging is present without significant stenosis.  IMPRESSION: 1. Mild facet hypertrophy and lateral disc bulging bilaterally  without significant stenosis. 2. Minimal leftward disc bulging at L5-S1 without significant stenosis. 3. No other significant focal disc protrusion or stenosis. 4. Mild leftward curvature of the lumbar spine is centered at L3-4. 5. Transitional anatomy at S1 with a diminutive disc at S1-2. 6. The conus medullaris terminates at the lower limits of normal, the L2-3 disc.   Electronically Signed   By: Lawrence Santiago M.D.   On: 12/29/2013 21:38   Dg Bone Density  Impression:  Clinical stage I non-small cell lung cancer, status post SBRT. Most recent chest CT scan shows groundglass appearance to the area of previous treatment in the left lower lobe no obvious active cancer in this area. The patient has new fracture T6 which could possibly be pathologic.  Plan:  MRI of the thoracic spine. This is scheduled for this afternoon if the patient can be contacted as an unexpected opening occurred.    the patient may be a candidate for vertebroplasty to help with pain issues.  ____________________________________ Blair Promise, MD

## 2014-01-10 NOTE — Telephone Encounter (Signed)
Called patient to inform of MRI for today @ 3 pm @ [facility], lvm  For a return call.

## 2014-01-18 ENCOUNTER — Ambulatory Visit (HOSPITAL_COMMUNITY): Admission: RE | Admit: 2014-01-18 | Payer: Medicare HMO | Source: Ambulatory Visit

## 2014-01-23 ENCOUNTER — Other Ambulatory Visit: Payer: Self-pay | Admitting: Radiation Oncology

## 2014-01-23 ENCOUNTER — Telehealth: Payer: Self-pay | Admitting: Pulmonary Disease

## 2014-01-23 DIAGNOSIS — J9610 Chronic respiratory failure, unspecified whether with hypoxia or hypercapnia: Secondary | ICD-10-CM

## 2014-01-23 DIAGNOSIS — C3430 Malignant neoplasm of lower lobe, unspecified bronchus or lung: Secondary | ICD-10-CM

## 2014-01-23 NOTE — Telephone Encounter (Signed)
Spoke with the pt  She states that her insurance changed and she is needing to switch from [facility] to [place] for her o2 needs  I have sent order to [facility]  Nothing further needed

## 2014-01-29 ENCOUNTER — Inpatient Hospital Stay: Admission: RE | Admit: 2014-01-29 | Payer: Commercial Managed Care - HMO | Source: Ambulatory Visit

## 2014-01-29 ENCOUNTER — Emergency Department (HOSPITAL_COMMUNITY)
Admission: EM | Admit: 2014-01-29 | Discharge: 2014-01-29 | Disposition: A | Discharge status: Home / Self Care | Payer: Medicare HMO | Attending: Emergency Medicine | Admitting: Emergency Medicine

## 2014-01-29 ENCOUNTER — Emergency Department (HOSPITAL_COMMUNITY): Payer: Medicare HMO

## 2014-01-29 ENCOUNTER — Encounter (HOSPITAL_COMMUNITY): Payer: Self-pay | Admitting: Emergency Medicine

## 2014-01-29 DIAGNOSIS — R0602 Shortness of breath: Secondary | ICD-10-CM | POA: Insufficient documentation

## 2014-01-29 DIAGNOSIS — Z880 Allergy status to penicillin: Secondary | ICD-10-CM | POA: Insufficient documentation

## 2014-01-29 DIAGNOSIS — K5900 Constipation, unspecified: Secondary | ICD-10-CM | POA: Insufficient documentation

## 2014-01-29 DIAGNOSIS — K644 Residual hemorrhoidal skin tags: Secondary | ICD-10-CM | POA: Insufficient documentation

## 2014-01-29 DIAGNOSIS — I2510 Atherosclerotic heart disease of native coronary artery without angina pectoris: Secondary | ICD-10-CM | POA: Insufficient documentation

## 2014-01-29 DIAGNOSIS — N390 Urinary tract infection, site not specified: Secondary | ICD-10-CM | POA: Insufficient documentation

## 2014-01-29 DIAGNOSIS — J449 Chronic obstructive pulmonary disease, unspecified: Secondary | ICD-10-CM | POA: Insufficient documentation

## 2014-01-29 DIAGNOSIS — Z923 Personal history of irradiation: Secondary | ICD-10-CM | POA: Insufficient documentation

## 2014-01-29 DIAGNOSIS — Z9221 Personal history of antineoplastic chemotherapy: Secondary | ICD-10-CM | POA: Insufficient documentation

## 2014-01-29 DIAGNOSIS — J4489 Other specified chronic obstructive pulmonary disease: Secondary | ICD-10-CM | POA: Insufficient documentation

## 2014-01-29 DIAGNOSIS — F17200 Nicotine dependence, unspecified, uncomplicated: Secondary | ICD-10-CM | POA: Insufficient documentation

## 2014-01-29 DIAGNOSIS — Z85819 Personal history of malignant neoplasm of unspecified site of lip, oral cavity, and pharynx: Secondary | ICD-10-CM | POA: Insufficient documentation

## 2014-01-29 DIAGNOSIS — Z90710 Acquired absence of both cervix and uterus: Secondary | ICD-10-CM | POA: Insufficient documentation

## 2014-01-29 DIAGNOSIS — E785 Hyperlipidemia, unspecified: Secondary | ICD-10-CM | POA: Insufficient documentation

## 2014-01-29 DIAGNOSIS — Z79899 Other long term (current) drug therapy: Secondary | ICD-10-CM | POA: Insufficient documentation

## 2014-01-29 DIAGNOSIS — I10 Essential (primary) hypertension: Secondary | ICD-10-CM | POA: Insufficient documentation

## 2014-01-29 DIAGNOSIS — Z85118 Personal history of other malignant neoplasm of bronchus and lung: Secondary | ICD-10-CM | POA: Insufficient documentation

## 2014-01-29 DIAGNOSIS — R110 Nausea: Secondary | ICD-10-CM | POA: Insufficient documentation

## 2014-01-29 DIAGNOSIS — R109 Unspecified abdominal pain: Secondary | ICD-10-CM | POA: Insufficient documentation

## 2014-01-29 DIAGNOSIS — K219 Gastro-esophageal reflux disease without esophagitis: Secondary | ICD-10-CM | POA: Insufficient documentation

## 2014-01-29 DIAGNOSIS — Z7982 Long term (current) use of aspirin: Secondary | ICD-10-CM | POA: Insufficient documentation

## 2014-01-29 DIAGNOSIS — R609 Edema, unspecified: Secondary | ICD-10-CM | POA: Insufficient documentation

## 2014-01-29 LAB — CBC WITH DIFFERENTIAL/PLATELET
BASOS PCT: 0 % (ref 0–1)
Basophils Absolute: 0 10*3/uL (ref 0.0–0.1)
EOS PCT: 3 % (ref 0–5)
Eosinophils Absolute: 0.3 10*3/uL (ref 0.0–0.7)
HCT: 33.5 % — ABNORMAL LOW (ref 36.0–46.0)
Hemoglobin: 10.9 g/dL — ABNORMAL LOW (ref 12.0–15.0)
Lymphocytes Relative: 29 % (ref 12–46)
Lymphs Abs: 2.5 10*3/uL (ref 0.7–4.0)
MCH: 29.8 pg (ref 26.0–34.0)
MCHC: 32.5 g/dL (ref 30.0–36.0)
MCV: 91.5 fL (ref 78.0–100.0)
Monocytes Absolute: 0.7 10*3/uL (ref 0.1–1.0)
Monocytes Relative: 8 % (ref 3–12)
Neutro Abs: 5.1 10*3/uL (ref 1.7–7.7)
Neutrophils Relative %: 60 % (ref 43–77)
PLATELETS: 313 10*3/uL (ref 150–400)
RBC: 3.66 MIL/uL — ABNORMAL LOW (ref 3.87–5.11)
RDW: 13.8 % (ref 11.5–15.5)
WBC: 8.7 10*3/uL (ref 4.0–10.5)

## 2014-01-29 LAB — COMPREHENSIVE METABOLIC PANEL
ALBUMIN: 3.7 g/dL (ref 3.5–5.2)
ALK PHOS: 76 U/L (ref 39–117)
ALT: 8 U/L (ref 0–35)
AST: 13 U/L (ref 0–37)
BUN: 11 mg/dL (ref 6–23)
CALCIUM: 9.6 mg/dL (ref 8.4–10.5)
CO2: 29 mEq/L (ref 19–32)
Chloride: 95 mEq/L — ABNORMAL LOW (ref 96–112)
Creatinine, Ser: 0.6 mg/dL (ref 0.50–1.10)
GFR calc Af Amer: >90 mL/min (ref >90)
GFR calc non Af Amer: 86 mL/min — ABNORMAL LOW (ref >90)
Glucose, Bld: 111 mg/dL — ABNORMAL HIGH (ref 70–99)
POTASSIUM: 4.1 meq/L (ref 3.7–5.3)
SODIUM: 137 meq/L (ref 137–147)
Total Bilirubin: 0.3 mg/dL (ref 0.3–1.2)
Total Protein: 7.3 g/dL (ref 6.0–8.3)

## 2014-01-29 LAB — URINALYSIS, ROUTINE W REFLEX MICROSCOPIC
Bilirubin Urine: NEGATIVE
GLUCOSE, UA: NEGATIVE mg/dL
Hgb urine dipstick: NEGATIVE
KETONES UR: NEGATIVE mg/dL
NITRITE: POSITIVE — AB
Protein, ur: NEGATIVE mg/dL
Specific Gravity, Urine: 1.006 (ref 1.005–1.030)
UROBILINOGEN UA: 0.2 mg/dL (ref 0.0–1.0)
pH: 7.5 (ref 5.0–8.0)

## 2014-01-29 LAB — URINE MICROSCOPIC-ADD ON

## 2014-01-29 LAB — I-STAT TROPONIN, ED: TROPONIN I, POC: 0 ng/mL (ref 0.00–0.08)

## 2014-01-29 LAB — LIPASE, BLOOD: Lipase: 25 U/L (ref 11–59)

## 2014-01-29 LAB — PRO B NATRIURETIC PEPTIDE: PRO B NATRI PEPTIDE: 81.4 pg/mL (ref 0–450)

## 2014-01-29 MED ORDER — IOHEXOL 300 MG/ML  SOLN
50.0000 mL | Freq: Once | INTRAMUSCULAR | Status: AC | PRN
  Administered 2014-01-29: 50 mL via ORAL

## 2014-01-29 MED ORDER — CIPROFLOXACIN HCL 500 MG PO TABS
500.0000 mg | ORAL_TABLET | Freq: Once | ORAL | Status: AC
  Administered 2014-01-29: 500 mg via ORAL
  Filled 2014-01-29: qty 1

## 2014-01-29 MED ORDER — IOHEXOL 300 MG/ML  SOLN
100.0000 mL | Freq: Once | INTRAMUSCULAR | Status: AC | PRN
  Administered 2014-01-29: 100 mL via INTRAVENOUS

## 2014-01-29 MED ORDER — FENTANYL CITRATE 0.05 MG/ML IJ SOLN
50.0000 ug | Freq: Once | INTRAMUSCULAR | Status: AC
  Administered 2014-01-29: 50 ug via INTRAVENOUS
  Filled 2014-01-29: qty 2

## 2014-01-29 MED ORDER — IPRATROPIUM-ALBUTEROL 0.5-2.5 (3) MG/3ML IN SOLN
3.0000 mL | Freq: Once | RESPIRATORY_TRACT | Status: AC
  Administered 2014-01-29: 3 mL via RESPIRATORY_TRACT
  Filled 2014-01-29: qty 3

## 2014-01-29 MED ORDER — LORAZEPAM 1 MG PO TABS
1.0000 mg | ORAL_TABLET | Freq: Once | ORAL | Status: AC
  Administered 2014-01-29: 1 mg via ORAL
  Filled 2014-01-29: qty 1

## 2014-01-29 MED ORDER — CIPROFLOXACIN HCL 250 MG PO TABS
250.0000 mg | ORAL_TABLET | Freq: Two times a day (BID) | ORAL | Status: DC
Start: 2014-01-29 — End: 2014-04-17

## 2014-01-29 NOTE — ED Provider Notes (Signed)
CSN: [id number]     Arrival date & time 01/29/14  1440 History   First MD Initiated Contact with Patient 01/29/14 1502     Chief Complaint  Patient presents with  . Abdominal Pain  . Shortness of Breath     (Consider location/radiation/quality/duration/timing/severity/associated sxs/prior Treatment) HPI 80 year old female presents with abdominal pain and constipation for about 8 days. She states 3 weeks ago she fell and was found to have a T13 fracture and sent home with hydrocodone. Since then she's developed constipation. She's tried Dulcolax, milk of magnesia, and one enema. She states she had a small bowel movement but otherwise has not had any since. Has had a little bit of nausea but no vomiting. Denies any shortness of breath versus her normal, chronic shortness of breath from COPD. She's also noted leg swelling since the fall bilaterally. She's tried elevating her legs with some success. Denies any chest pain. She rates the pain as severe.  Past Medical History  Diagnosis Date  . Thyroid disease   . Hypertension   . Coronary artery disease     Mild on cath 2011  . Hyperlipidemia   . Lung nodule   . Sleep apnea   . GERD (gastroesophageal reflux disease)   . S/P chemotherapy, time since greater than 12 weeks 1995     Chemoradiation at [facility] Fall right upper lobe lung  . S/P radiation therapy 1995    right upper lobe lung  . Hx of radiation therapy 2/24, 2/26, 2/28, 3/5, 10/31/12    LLL lung- 60 gray in 5 fx, SBRT  . COPD (chronic obstructive pulmonary disease)   . Jaw sarcoma   . Lung cancer 1995   Past Surgical History  Procedure Laterality Date  . Vaginal hysterectomy    . Lung surgery    . Sarcoma      Jaw surgery  . Abdominal hysterectomy    . Lung biopsy     Family History  Problem Relation Age of Onset  . Cancer Mother     mother  . Hypertension Mother   . Heart disease Mother   . Allergic rhinitis Mother   . Arthritis Mother    History  Substance Use Topics   . Smoking status: Current Every Day Smoker -- 1.00 packs/day for 62 years    Types: Cigarettes, Cigars  . Smokeless tobacco: Not on file     Comment: 5-6 cigs a day  . Alcohol Use: No   OB History   Grav Para Term Preterm Abortions TAB SAB Ect Mult Living                 Review of Systems  Constitutional: Negative for fever.  Respiratory: Negative for shortness of breath (chronic dyspnea, not worse now).   Cardiovascular: Positive for leg swelling. Negative for chest pain.  Gastrointestinal: Positive for nausea, abdominal pain, constipation and abdominal distention. Negative for vomiting.  Genitourinary: Negative for dysuria.  All other systems reviewed and are negative.     Allergies  Fish allergy and Penicillins  Home Medications   Prior to Admission medications   Medication Sig Start Date End Date Taking? Authorizing Provider  acetaminophen (TYLENOL) 500 MG tablet Take 1,000 mg by mouth every 6 (six) hours as needed.    Historical Provider, MD  alclomethasone (ACLOVATE) 0.05 % cream Apply 1 application topically 2 (two) times daily. Under breast 11/01/12   Historical Provider, MD  ALPRAZolam Duanne Moron) 1 MG tablet Take 1 mg by mouth 2 (two)  times daily as needed for anxiety.    Historical Provider, MD  aspirin 81 MG tablet Take 81 mg by mouth every morning.     Historical Provider, MD  atenolol (TENORMIN) 25 MG tablet Take 25 mg by mouth every morning.  06/14/12   Historical Provider, MD  citalopram (CELEXA) 20 MG tablet Take 20 mg by mouth daily.  03/07/13   Historical Provider, MD  docusate sodium (COLACE) 100 MG capsule Take 100 mg by mouth 2 (two) times daily.    Historical Provider, MD  econazole nitrate 1 % cream Apply 1 application topically 2 (two) times daily. Under breast 11/01/12   Historical Provider, MD  furosemide (LASIX) 20 MG tablet Take 20 mg by mouth daily as needed for fluid or edema.  05/27/12   Historical Provider, MD  HYDROcodone-acetaminophen (NORCO) 5-325  MG per tablet Take 1-2 tablets by mouth every 6 (six) hours as needed for moderate pain. 12/26/13   Lesly Dukes, MD  levothyroxine (SYNTHROID, LEVOTHROID) 100 MCG tablet Take 100 mcg by mouth daily before breakfast.     Historical Provider, MD  lisinopril (PRINIVIL,ZESTRIL) 10 MG tablet Take 10 mg by mouth 2 (two) times daily.  05/03/12   Historical Provider, MD  LORazepam (ATIVAN) 1 MG tablet Take 0.5-1 mg by mouth 2 (two) times daily as needed for anxiety.     Historical Provider, MD  Melatonin 3 MG TABS Take 1 tablet by mouth at bedtime.    Historical Provider, MD  mometasone-formoterol (DULERA) 100-5 MCG/ACT AERO Inhale 2 puffs into the lungs 2 (two) times daily. 07/06/13   Kathee Delton, MD  omeprazole (PRILOSEC) 40 MG capsule Take 40 mg by mouth every morning.     Historical Provider, MD  rosuvastatin (CRESTOR) 40 MG tablet Take 40 mg by mouth every evening.     Historical Provider, MD  tiotropium (SPIRIVA) 18 MCG inhalation capsule Place 1 capsule (18 mcg total) into inhaler and inhale daily. 09/28/13   Kathee Delton, MD   BP 157/65  Pulse 108  Temp(Src) 97.7 F (36.5 C) (Oral)  Resp 22  SpO2 98% Physical Exam  Nursing note and vitals reviewed. Constitutional: She is oriented to person, place, and time. She appears well-developed and well-nourished.  HENT:  Head: Normocephalic and atraumatic.  Right Ear: External ear normal.  Left Ear: External ear normal.  Nose: Nose normal.  Eyes: Right eye exhibits no discharge. Left eye exhibits no discharge.  Cardiovascular: Regular rhythm and normal heart sounds.  Tachycardia present.   Pulmonary/Chest: Effort normal. She has wheezes (scant wheezes).  Abdominal: Soft. She exhibits no distension. There is tenderness (vague, nonspecific, nonfocal tenderness).  Genitourinary: Rectal exam shows external hemorrhoid (nonthrombosed, non tender).  No stool on rectal exam, no impaction  Musculoskeletal: She exhibits edema (mild bilateral lower  extremity pitting edema).  Neurological: She is alert and oriented to person, place, and time.  Skin: Skin is warm and dry.    ED Course  Procedures (including critical care time) Labs Review Labs Reviewed  CBC WITH DIFFERENTIAL - Abnormal; Notable for the following:    RBC 3.66 (*)    Hemoglobin 10.9 (*)    HCT 33.5 (*)    All other components within normal limits  COMPREHENSIVE METABOLIC PANEL - Abnormal; Notable for the following:    Chloride 95 (*)    Glucose, Bld 111 (*)    GFR calc non Af Amer 86 (*)    All other components within normal limits  URINALYSIS,  ROUTINE W REFLEX MICROSCOPIC - Abnormal; Notable for the following:    Nitrite POSITIVE (*)    Leukocytes, UA TRACE (*)    All other components within normal limits  URINE MICROSCOPIC-ADD ON - Abnormal; Notable for the following:    Bacteria, UA MANY (*)    All other components within normal limits  URINE CULTURE  LIPASE, BLOOD  PRO B NATRIURETIC PEPTIDE  I-STAT TROPOININ, ED    Imaging Review Ct Abdomen Pelvis W Contrast  01/29/2014   CLINICAL DATA:  Abdominal distention and discomfort for 7 days.  EXAM: CT ABDOMEN AND PELVIS WITH CONTRAST  TECHNIQUE: Multidetector CT imaging of the abdomen and pelvis was performed using the standard protocol following bolus administration of intravenous contrast.  CONTRAST:  150mL OMNIPAQUE IOHEXOL 300 MG/ML SOLN, 69mL OMNIPAQUE IOHEXOL 300 MG/ML SOLN  COMPARISON:  None.  FINDINGS: BODY WALL: Unremarkable.  LOWER CHEST: Unremarkable.  ABDOMEN/PELVIS:  Liver: No focal abnormality.  Biliary: No evidence of biliary obstruction or stone.  Pancreas: Unremarkable.  Spleen: Unremarkable.  Adrenals: Slightly nodular left adrenal, similar to priors.  Kidneys and ureters: No hydronephrosis or stone.  Bladder: Unremarkable.  Reproductive: Unremarkable.  Bowel: No obstruction. Normal appendix. Diverticulosis without diverticulitis.  Retroperitoneum: No mass or adenopathy.  Peritoneum: No free fluid or  gas.  Vascular: No acute abnormality. Renal artery calcification. Aortoiliac calcification is non aneurysmal.  OSSEOUS: No acute abnormalities.  IMPRESSION: No acute intra-abdominal or pelvic abnormalities.   Electronically Signed   By: Rolla Flatten M.D.   On: 01/29/2014 20:40   Dg Abd Acute W/chest  01/29/2014   CLINICAL DATA:  Constipation. Abdominal pain. Productive cough. History of lung cancer.  EXAM: ACUTE ABDOMEN SERIES (ABDOMEN 2 VIEW & CHEST 1 VIEW)  COMPARISON:  12/26/2013 chest x-ray and chest CT.  FINDINGS: Similar appearance of post treatment changes involving the lungs greater right lung apex with loss of volume and retraction of the trachea towards the right  No segmental consolidation, gross pneumothorax or pulmonary edema. Mild central pulmonary vascular prominence.  Heart size top-normal to slightly enlarged but without change.  Carotid artery calcifications.  Nonspecific bowel gas pattern with gas-filled top-normal size small bowel loops within the pelvis.  No free intraperitoneal air.  Mild scoliosis of the lumbar spine convex the left.  T6 compression fracture noted on CT not appreciated on the present plain film exam.  IMPRESSION: Nonspecific bowel gas pattern with gas-filled top-normal size small bowel loops within the pelvis.  Similar appearance of post therapy changes involving the lung as detailed above.   Electronically Signed   By: Chauncey Cruel M.D.   On: 01/29/2014 16:18     EKG Interpretation None      MDM   Final diagnoses:  Abdominal pain  UTI (lower urinary tract infection)    Her abdominal workup is benign except for UTI. No evidence of impaction or significant constipation. She is denying shortness of breath or dyspnea. Her leg swelling appears to be chronic when talking to family, and no source has been found. No obvious source today, and her swelling is symmetrical. At this time will recommend colace, miralax, fiber and will treat UTI with antibiotics. Will  follow up with PCP.    Ephraim Hamburger, MD 01/30/14 [phone]

## 2014-01-29 NOTE — ED Notes (Addendum)
Per EMS patient with Hx of COPD reports to ED after visiting PCP today for abdominal distention/discomfort, SOB, x 7 days. Pt reports that three weeks ago she felt sharp, stabbing pain in back, found T-13 fracture and was sent home with hydrocodone which led to constipation. Pt states she is scheduled for MRI tomorrow. 5 mg albuterol administered by EMS via nebulizer for wheezing.  On assessment, patient's feet display erythema, excessive warmth, and pitting edema bilaterally. Left great toe is hot to touch. Pedal pulses 2+ moderate.

## 2014-01-29 NOTE — Discharge Instructions (Signed)
Abdominal Pain, Adult °Many things can cause abdominal pain. Usually, abdominal pain is not caused by a disease and will improve without treatment. It can often be observed and treated at home. Your health care provider will do a physical exam and possibly order blood tests and X-rays to help determine the seriousness of your pain. However, in many cases, more time must pass before a clear cause of the pain can be found. Before that point, your health care provider may not know if you need more testing or further treatment. °HOME CARE INSTRUCTIONS  °Monitor your abdominal pain for any changes. The following actions may help to alleviate any discomfort you are experiencing: °· Only take over-the-counter or prescription medicines as directed by your health care provider. °· Do not take laxatives unless directed to do so by your health care provider. °· Try a clear liquid diet (broth, tea, or water) as directed by your health care provider. Slowly move to a bland diet as tolerated. °SEEK MEDICAL CARE IF: °· You have unexplained abdominal pain. °· You have abdominal pain associated with nausea or diarrhea. °· You have pain when you urinate or have a bowel movement. °· You experience abdominal pain that wakes you in the night. °· You have abdominal pain that is worsened or improved by eating food. °· You have abdominal pain that is worsened with eating fatty foods. °SEEK IMMEDIATE MEDICAL CARE IF:  °· Your pain does not go away within 2 hours. °· You have a fever. °· You keep throwing up (vomiting). °· Your pain is felt only in portions of the abdomen, such as the right side or the left lower portion of the abdomen. °· You pass bloody or black tarry stools. °MAKE SURE YOU: °· Understand these instructions.   °· Will watch your condition.   °· Will get help right away if you are not doing well or get worse.   °Document Released: 05/20/2005 Document Revised: 05/31/2013 Document Reviewed: 04/19/2013 °[employer]® Patient  Information ©2014 [employer]. ° °

## 2014-01-29 NOTE — ED Notes (Signed)
Bed: RESB Expected date:  Expected time:  Means of arrival:  Comments: EMS - abd pain, hypertension

## 2014-01-29 NOTE — ED Notes (Signed)
MD at bedside. 

## 2014-01-29 NOTE — ED Notes (Signed)
Assisted pt on to bedpan

## 2014-01-29 NOTE — ED Notes (Signed)
Bladder scan results 530ml of urine

## 2014-01-29 NOTE — ED Notes (Signed)
Bed: QM08 Expected date:  Expected time:  Means of arrival:  Comments: ResB

## 2014-01-29 NOTE — ED Notes (Signed)
Patient transported to CT 

## 2014-01-29 NOTE — ED Notes (Signed)
Respiratory at bedside.

## 2014-01-31 LAB — URINE CULTURE: Colony Count: >=100000

## 2014-02-04 ENCOUNTER — Telehealth (HOSPITAL_BASED_OUTPATIENT_CLINIC_OR_DEPARTMENT_OTHER): Payer: Self-pay | Admitting: Emergency Medicine

## 2014-02-04 NOTE — Telephone Encounter (Signed)
Per [facility] PA-C, no treatment needed

## 2014-02-08 ENCOUNTER — Telehealth: Payer: Self-pay | Admitting: Pulmonary Disease

## 2014-02-08 NOTE — Telephone Encounter (Signed)
lmtcb for pt.  

## 2014-02-08 NOTE — Telephone Encounter (Signed)
Spoke with pt.  She was changed from [facility] to [place] due to her insurance.  Pt states that the machine is too big and they only sent to portable tanks that she has to fill and this takes about 2 hrs to fill them and they only last approx 1 1/2 hrs.  She prefers the tanks that roll and are already prefilled.  I spoke with Felicia at [facility] and she is going to check into what they can do and call us back.

## 2014-02-08 NOTE — Telephone Encounter (Signed)
[facility] called back they are going to change her tanks to reg ones they dont have a time frame but hopefully today

## 2014-02-09 NOTE — Telephone Encounter (Signed)
lmtcbx2 on home and cell to make sure the pt was contacted by [facility].

## 2014-02-12 NOTE — Telephone Encounter (Signed)
lmomtcb x 3 for pt 

## 2014-02-14 ENCOUNTER — Other Ambulatory Visit: Payer: Self-pay | Admitting: Pulmonary Disease

## 2014-02-14 ENCOUNTER — Other Ambulatory Visit: Payer: Self-pay | Admitting: *Deleted

## 2014-02-14 DIAGNOSIS — J439 Emphysema, unspecified: Secondary | ICD-10-CM

## 2014-02-14 MED ORDER — MOMETASONE FURO-FORMOTEROL FUM 100-5 MCG/ACT IN AERO: 2.0000 | INHALATION_SPRAY | Freq: Two times a day (BID) | RESPIRATORY_TRACT | Status: AC

## 2014-02-14 NOTE — Telephone Encounter (Signed)
Per protocol I will sign off on message. Jennifer Castillo, CMA  

## 2014-02-20 ENCOUNTER — Telehealth: Payer: Self-pay | Admitting: Internal Medicine

## 2014-02-20 NOTE — Telephone Encounter (Signed)
Called and lmomtcb for tanya from [place] to call back regarding this pt.

## 2014-02-21 NOTE — Telephone Encounter (Signed)
LMTCBx2. Keora Eccleston, CMA  

## 2014-02-26 NOTE — Telephone Encounter (Signed)
LMTCBx3. Shenelle Klas, CMA  

## 2014-02-27 NOTE — Telephone Encounter (Signed)
ONO was done, nothing further needed. Talbot Bing, CMA

## 2014-02-28 ENCOUNTER — Telehealth: Payer: Self-pay | Admitting: Pulmonary Disease

## 2014-02-28 NOTE — Telephone Encounter (Signed)
Let pt know that her ONO showed her oxygen went down to 79% during the night.  She is to stay on oxygen during sleep .

## 2014-03-02 NOTE — Telephone Encounter (Signed)
Pt advised. Fajr Fife, CMA  

## 2014-04-11 ENCOUNTER — Other Ambulatory Visit: Payer: Self-pay | Admitting: Family Medicine

## 2014-04-11 DIAGNOSIS — N6325 Unspecified lump in the left breast, overlapping quadrants: Secondary | ICD-10-CM

## 2014-04-11 DIAGNOSIS — N6320 Unspecified lump in the left breast, unspecified quadrant: Principal | ICD-10-CM

## 2014-04-17 ENCOUNTER — Encounter (HOSPITAL_COMMUNITY): Payer: Self-pay | Admitting: Emergency Medicine

## 2014-04-17 ENCOUNTER — Emergency Department (HOSPITAL_COMMUNITY): Payer: Medicare HMO

## 2014-04-17 ENCOUNTER — Telehealth: Payer: Self-pay | Admitting: *Deleted

## 2014-04-17 ENCOUNTER — Encounter: Payer: Self-pay | Admitting: Pulmonary Disease

## 2014-04-17 ENCOUNTER — Observation Stay (HOSPITAL_COMMUNITY)
Admission: EM | Admit: 2014-04-17 | Discharge: 2014-04-19 | Disposition: A | Discharge status: Home / Self Care | Payer: Medicare HMO | Attending: Internal Medicine | Admitting: Internal Medicine

## 2014-04-17 ENCOUNTER — Other Ambulatory Visit: Payer: Medicare Other

## 2014-04-17 DIAGNOSIS — I10 Essential (primary) hypertension: Secondary | ICD-10-CM | POA: Insufficient documentation

## 2014-04-17 DIAGNOSIS — I2510 Atherosclerotic heart disease of native coronary artery without angina pectoris: Secondary | ICD-10-CM | POA: Diagnosis not present

## 2014-04-17 DIAGNOSIS — R0789 Other chest pain: Secondary | ICD-10-CM

## 2014-04-17 DIAGNOSIS — Z923 Personal history of irradiation: Secondary | ICD-10-CM | POA: Insufficient documentation

## 2014-04-17 DIAGNOSIS — Z9981 Dependence on supplemental oxygen: Secondary | ICD-10-CM | POA: Diagnosis not present

## 2014-04-17 DIAGNOSIS — Z7982 Long term (current) use of aspirin: Secondary | ICD-10-CM | POA: Insufficient documentation

## 2014-04-17 DIAGNOSIS — I6529 Occlusion and stenosis of unspecified carotid artery: Secondary | ICD-10-CM | POA: Insufficient documentation

## 2014-04-17 DIAGNOSIS — G4733 Obstructive sleep apnea (adult) (pediatric): Secondary | ICD-10-CM | POA: Diagnosis not present

## 2014-04-17 DIAGNOSIS — J438 Other emphysema: Secondary | ICD-10-CM | POA: Insufficient documentation

## 2014-04-17 DIAGNOSIS — F17200 Nicotine dependence, unspecified, uncomplicated: Secondary | ICD-10-CM | POA: Diagnosis not present

## 2014-04-17 DIAGNOSIS — Z79899 Other long term (current) drug therapy: Secondary | ICD-10-CM | POA: Insufficient documentation

## 2014-04-17 DIAGNOSIS — E785 Hyperlipidemia, unspecified: Secondary | ICD-10-CM | POA: Insufficient documentation

## 2014-04-17 DIAGNOSIS — Z880 Allergy status to penicillin: Secondary | ICD-10-CM | POA: Diagnosis not present

## 2014-04-17 DIAGNOSIS — R079 Chest pain, unspecified: Principal | ICD-10-CM

## 2014-04-17 DIAGNOSIS — X58XXXA Exposure to other specified factors, initial encounter: Secondary | ICD-10-CM | POA: Diagnosis not present

## 2014-04-17 DIAGNOSIS — S22009A Unspecified fracture of unspecified thoracic vertebra, initial encounter for closed fracture: Secondary | ICD-10-CM | POA: Insufficient documentation

## 2014-04-17 DIAGNOSIS — Z91013 Allergy to seafood: Secondary | ICD-10-CM | POA: Insufficient documentation

## 2014-04-17 DIAGNOSIS — K219 Gastro-esophageal reflux disease without esophagitis: Secondary | ICD-10-CM | POA: Insufficient documentation

## 2014-04-17 DIAGNOSIS — R911 Solitary pulmonary nodule: Secondary | ICD-10-CM | POA: Diagnosis present

## 2014-04-17 DIAGNOSIS — Y929 Unspecified place or not applicable: Secondary | ICD-10-CM | POA: Diagnosis not present

## 2014-04-17 DIAGNOSIS — C3430 Malignant neoplasm of lower lobe, unspecified bronchus or lung: Secondary | ICD-10-CM | POA: Diagnosis present

## 2014-04-17 DIAGNOSIS — R072 Precordial pain: Secondary | ICD-10-CM

## 2014-04-17 DIAGNOSIS — J439 Emphysema, unspecified: Secondary | ICD-10-CM | POA: Diagnosis present

## 2014-04-17 DIAGNOSIS — Z720 Tobacco use: Secondary | ICD-10-CM

## 2014-04-17 DIAGNOSIS — E079 Disorder of thyroid, unspecified: Secondary | ICD-10-CM | POA: Insufficient documentation

## 2014-04-17 DIAGNOSIS — Z85118 Personal history of other malignant neoplasm of bronchus and lung: Secondary | ICD-10-CM | POA: Insufficient documentation

## 2014-04-17 LAB — COMPREHENSIVE METABOLIC PANEL
ALBUMIN: 3.7 g/dL (ref 3.5–5.2)
ALK PHOS: 71 U/L (ref 39–117)
ALT: 9 U/L (ref 0–35)
ALT: 8 U/L (ref 0–35)
ANION GAP: 9 (ref 5–15)
ANION GAP: 13 (ref 5–15)
AST: 12 U/L (ref 0–37)
AST: 11 U/L (ref 0–37)
Albumin: 3.5 g/dL (ref 3.5–5.2)
Alkaline Phosphatase: 67 U/L (ref 39–117)
BILIRUBIN TOTAL: 0.4 mg/dL (ref 0.3–1.2)
BUN: 11 mg/dL (ref 6–23)
BUN: 13 mg/dL (ref 6–23)
CALCIUM: 9.7 mg/dL (ref 8.4–10.5)
CHLORIDE: 97 meq/L (ref 96–112)
CO2: 32 meq/L (ref 19–32)
CO2: 29 mEq/L (ref 19–32)
CREATININE: 0.5 mg/dL (ref 0.50–1.10)
Calcium: 9.9 mg/dL (ref 8.4–10.5)
Chloride: 98 mEq/L (ref 96–112)
Creatinine, Ser: 0.64 mg/dL (ref 0.50–1.10)
GFR calc Af Amer: >90 mL/min (ref >90)
GFR calc Af Amer: >90 mL/min (ref >90)
GFR calc non Af Amer: >90 mL/min (ref >90)
GFR calc non Af Amer: 83 mL/min — ABNORMAL LOW (ref >90)
Glucose, Bld: 161 mg/dL — ABNORMAL HIGH (ref 70–99)
Glucose, Bld: 121 mg/dL — ABNORMAL HIGH (ref 70–99)
Potassium: 4.4 mEq/L (ref 3.7–5.3)
Potassium: 4.2 mEq/L (ref 3.7–5.3)
Sodium: 138 mEq/L (ref 137–147)
Sodium: 140 mEq/L (ref 137–147)
TOTAL PROTEIN: 7.2 g/dL (ref 6.0–8.3)
Total Bilirubin: 0.3 mg/dL (ref 0.3–1.2)
Total Protein: 7.7 g/dL (ref 6.0–8.3)

## 2014-04-17 LAB — CBC WITH DIFFERENTIAL/PLATELET
Basophils Absolute: 0 10*3/uL (ref 0.0–0.1)
Basophils Relative: 0 % (ref 0–1)
Eosinophils Absolute: 0.3 10*3/uL (ref 0.0–0.7)
Eosinophils Relative: 3 % (ref 0–5)
HEMATOCRIT: 35.3 % — AB (ref 36.0–46.0)
HEMOGLOBIN: 11.3 g/dL — AB (ref 12.0–15.0)
LYMPHS ABS: 1.9 10*3/uL (ref 0.7–4.0)
LYMPHS PCT: 22 % (ref 12–46)
MCH: 29.3 pg (ref 26.0–34.0)
MCHC: 32 g/dL (ref 30.0–36.0)
MCV: 91.5 fL (ref 78.0–100.0)
MONO ABS: 0.9 10*3/uL (ref 0.1–1.0)
MONOS PCT: 10 % (ref 3–12)
NEUTROS ABS: 5.5 10*3/uL (ref 1.7–7.7)
Neutrophils Relative %: 65 % (ref 43–77)
Platelets: 397 10*3/uL (ref 150–400)
RBC: 3.86 MIL/uL — AB (ref 3.87–5.11)
RDW: 13.7 % (ref 11.5–15.5)
WBC: 8.6 10*3/uL (ref 4.0–10.5)

## 2014-04-17 LAB — I-STAT CHEM 8, ED
BUN: 11 mg/dL (ref 6–23)
CALCIUM ION: 1.26 mmol/L (ref 1.13–1.30)
CREATININE: 0.6 mg/dL (ref 0.50–1.10)
Chloride: 95 mEq/L — ABNORMAL LOW (ref 96–112)
Glucose, Bld: 154 mg/dL — ABNORMAL HIGH (ref 70–99)
HCT: 38 % (ref 36.0–46.0)
Hemoglobin: 12.9 g/dL (ref 12.0–15.0)
Potassium: 4.3 mEq/L (ref 3.7–5.3)
Sodium: 138 mEq/L (ref 137–147)
TCO2: 33 mmol/L (ref 0–100)

## 2014-04-17 LAB — T4, FREE: FREE T4: 1.07 ng/dL (ref 0.80–1.80)

## 2014-04-17 LAB — PROTIME-INR
INR: 0.93 (ref 0.00–1.49)
PROTHROMBIN TIME: 12.5 s (ref 11.6–15.2)

## 2014-04-17 LAB — PRO B NATRIURETIC PEPTIDE
Pro B Natriuretic peptide (BNP): 105.1 pg/mL (ref 0–450)
Pro B Natriuretic peptide (BNP): 5.5 pg/mL (ref 0–450)

## 2014-04-17 LAB — APTT: aPTT: 36 seconds (ref 24–37)

## 2014-04-17 LAB — TROPONIN I: Troponin I: <0.3 ng/mL (ref <0.30)

## 2014-04-17 MED ORDER — SODIUM CHLORIDE 0.9 % IV SOLN
INTRAVENOUS | Status: DC
  Administered 2014-04-17: 12:00:00 via INTRAVENOUS

## 2014-04-17 MED ORDER — NITROGLYCERIN 0.4 MG SL SUBL
0.4000 mg | SUBLINGUAL_TABLET | SUBLINGUAL | Status: DC | PRN
  Administered 2014-04-17: 0.4 mg via SUBLINGUAL

## 2014-04-17 MED ORDER — NITROGLYCERIN 0.4 MG SL SUBL: 0.4000 mg | SUBLINGUAL_TABLET | SUBLINGUAL | Status: DC | PRN

## 2014-04-17 MED ORDER — ASPIRIN 300 MG RE SUPP
300.0000 mg | RECTAL | Status: DC
  Filled 2014-04-17: qty 1

## 2014-04-17 MED ORDER — ATORVASTATIN CALCIUM 10 MG PO TABS
10.0000 mg | ORAL_TABLET | Freq: Every day | ORAL | Status: DC
  Administered 2014-04-17 – 2014-04-18 (×2): 10 mg via ORAL
  Filled 2014-04-17 (×3): qty 1

## 2014-04-17 MED ORDER — IOHEXOL 350 MG/ML SOLN
100.0000 mL | Freq: Once | INTRAVENOUS | Status: AC | PRN
  Administered 2014-04-17: 100 mL via INTRAVENOUS

## 2014-04-17 MED ORDER — MOMETASONE FURO-FORMOTEROL FUM 100-5 MCG/ACT IN AERO
2.0000 | INHALATION_SPRAY | Freq: Two times a day (BID) | RESPIRATORY_TRACT | Status: DC
  Administered 2014-04-17 – 2014-04-19 (×4): 2 via RESPIRATORY_TRACT
  Filled 2014-04-17: qty 8.8

## 2014-04-17 MED ORDER — FUROSEMIDE 20 MG PO TABS
20.0000 mg | ORAL_TABLET | Freq: Every day | ORAL | Status: DC | PRN
  Filled 2014-04-17: qty 1

## 2014-04-17 MED ORDER — NITROGLYCERIN 0.4 MG SL SUBL
SUBLINGUAL_TABLET | SUBLINGUAL | Status: AC
Start: 2014-04-17 — End: 2014-04-18
  Filled 2014-04-17: qty 1

## 2014-04-17 MED ORDER — ALBUTEROL SULFATE (2.5 MG/3ML) 0.083% IN NEBU
3.0000 mL | INHALATION_SOLUTION | RESPIRATORY_TRACT | Status: DC | PRN
  Administered 2014-04-17 – 2014-04-19 (×3): 3 mL via RESPIRATORY_TRACT
  Filled 2014-04-17 (×3): qty 3

## 2014-04-17 MED ORDER — ASPIRIN 81 MG PO CHEW
324.0000 mg | CHEWABLE_TABLET | ORAL | Status: DC
  Filled 2014-04-17: qty 4

## 2014-04-17 MED ORDER — NITROGLYCERIN 0.3 MG/HR TD PT24
0.3000 mg | MEDICATED_PATCH | Freq: Every day | TRANSDERMAL | Status: DC
  Administered 2014-04-17: 0.3 mg via TRANSDERMAL
  Filled 2014-04-17 (×2): qty 1

## 2014-04-17 MED ORDER — LEVOTHYROXINE SODIUM 100 MCG PO TABS
100.0000 ug | ORAL_TABLET | Freq: Every day | ORAL | Status: DC
  Administered 2014-04-18 – 2014-04-19 (×2): 100 ug via ORAL
  Filled 2014-04-17 (×3): qty 1

## 2014-04-17 MED ORDER — ASPIRIN EC 81 MG PO TBEC
81.0000 mg | DELAYED_RELEASE_TABLET | Freq: Every day | ORAL | Status: DC
  Administered 2014-04-18 – 2014-04-19 (×2): 81 mg via ORAL
  Filled 2014-04-17 (×2): qty 1

## 2014-04-17 MED ORDER — ACETAMINOPHEN 325 MG PO TABS
650.0000 mg | ORAL_TABLET | ORAL | Status: DC | PRN
  Administered 2014-04-18: 650 mg via ORAL
  Filled 2014-04-17: qty 2

## 2014-04-17 MED ORDER — ALPRAZOLAM 0.5 MG PO TABS
0.5000 mg | ORAL_TABLET | Freq: Three times a day (TID) | ORAL | Status: DC
  Administered 2014-04-17 – 2014-04-19 (×5): 0.5 mg via ORAL
  Filled 2014-04-17 (×5): qty 1

## 2014-04-17 MED ORDER — TIOTROPIUM BROMIDE MONOHYDRATE 18 MCG IN CAPS
18.0000 ug | ORAL_CAPSULE | Freq: Every day | RESPIRATORY_TRACT | Status: DC
  Administered 2014-04-18 – 2014-04-19 (×2): 18 ug via RESPIRATORY_TRACT
  Filled 2014-04-17: qty 5

## 2014-04-17 MED ORDER — NITROGLYCERIN 0.4 MG SL SUBL
SUBLINGUAL_TABLET | SUBLINGUAL | Status: AC
  Administered 2014-04-17: 0.4 mg
  Filled 2014-04-17: qty 1

## 2014-04-17 MED ORDER — ATENOLOL 25 MG PO TABS
25.0000 mg | ORAL_TABLET | Freq: Every morning | ORAL | Status: DC
  Administered 2014-04-18 – 2014-04-19 (×2): 25 mg via ORAL
  Filled 2014-04-17 (×2): qty 1

## 2014-04-17 MED ORDER — HEPARIN (PORCINE) IN NACL 100-0.45 UNIT/ML-% IJ SOLN
1100.0000 [IU]/h | INTRAMUSCULAR | Status: DC
  Administered 2014-04-17: 1100 [IU]/h via INTRAVENOUS
  Filled 2014-04-17: qty 250

## 2014-04-17 MED ORDER — ASPIRIN 325 MG PO TABS
325.0000 mg | ORAL_TABLET | Freq: Every day | ORAL | Status: DC
  Administered 2014-04-17: 325 mg via ORAL
  Filled 2014-04-17 (×2): qty 1

## 2014-04-17 MED ORDER — HEPARIN BOLUS VIA INFUSION
4000.0000 [IU] | Freq: Once | INTRAVENOUS | Status: AC
  Administered 2014-04-17: 4000 [IU] via INTRAVENOUS
  Filled 2014-04-17: qty 4000

## 2014-04-17 MED ORDER — PANTOPRAZOLE SODIUM 40 MG PO TBEC
40.0000 mg | DELAYED_RELEASE_TABLET | Freq: Every day | ORAL | Status: DC
  Administered 2014-04-18 – 2014-04-19 (×2): 40 mg via ORAL
  Filled 2014-04-17 (×2): qty 1

## 2014-04-17 MED ORDER — ALPRAZOLAM 0.25 MG PO TABS
0.2500 mg | ORAL_TABLET | Freq: Two times a day (BID) | ORAL | Status: DC | PRN
  Administered 2014-04-18 (×2): 0.25 mg via ORAL
  Filled 2014-04-17 (×2): qty 1

## 2014-04-17 MED ORDER — ONDANSETRON HCL 4 MG/2ML IJ SOLN
4.0000 mg | Freq: Four times a day (QID) | INTRAMUSCULAR | Status: DC | PRN
  Administered 2014-04-18: 4 mg via INTRAVENOUS
  Filled 2014-04-17: qty 2

## 2014-04-17 MED ORDER — MORPHINE SULFATE 2 MG/ML IJ SOLN
1.0000 mg | INTRAMUSCULAR | Status: DC | PRN
  Administered 2014-04-18 – 2014-04-19 (×2): 1 mg via INTRAVENOUS
  Filled 2014-04-17 (×2): qty 1

## 2014-04-17 NOTE — Progress Notes (Signed)
Attempt to call RN back for report. RN is not able to give report at this time. Number left for secretary. Will call back shortly. Setzer, Marchelle Folks

## 2014-04-17 NOTE — H&P (Signed)
. [facility] History and Physical  Alexandria Warren GUR:[id number] DOB: 11/27/1933 DOA: 04/17/2014  Referring physician: ED PCP: [name], MD  Specialists: none  Chief Complaint: CP  HPI: 80 y/o ?, known stage I NSLC s/p SBRT LLL nodule, hx XRT COPD on 3 L of oxygen at home, still smoker, multiple compression fractures in thoracic spine as recent as 12/2013 (T6) , 01/2014 (T 13)--MRI performed showed no lumbar disease however did not comment on thoracic disease?? 5/15, history CAD 2011 = 20% LAD, 30% RCA stenosis, hypertension, obstructive sleep apne presented to emergency room with chest pain she states that this started maybe about 2 days ago She was asleep at night and felt pressure in the center of her chest with no associated radiation or weakness. It was not relieved by anything specific  She then had her birthday party yesterday and subsequently her breath cut short she felt hot and diaphoretic and had recurrence of discomfort in her chest.  Blood pressure was elevated and she hadn't taken her medications so she took a Xanax and some other blood pressure pill blood pressure got better and she went to bed but woke up around 4 AM with pain in her back between her shoulder blades. This is in the vicinity of a prior T7 pathological fracture .   she states that the pressure and discomfort have never gone away but this is now more in the upper back.  She's had no fever no chills no nausea no vomiting no blurred vision no double vision or unilateral we is no rash No cough no cold  Emergency room workup showed normal labs Including normal point-of-care troponin   CT chest showed no aneurysm or dissection, severe aortic atherosclerosis, enlarging mass in the right hilum DDX tumor vs. lymphadenopathy New 75% pathological fracture T7  postradiation pneumonitis  EKG =   Review of Systems:  She's having no dyspnea No occult chest pain or pronounced chest pain but rather discomfort  in   between her shoulder blades None blurred or double vision or unilateral the is No lower extremity edema No sudden weight gain She always has shortness breath and uses oxygen at home Increasing the oxygen rate of flow has not changed her symptoms\ She's not been around anyone sick   Past Medical History  Diagnosis Date  . Thyroid disease   . Hypertension   . Coronary artery disease     Mild on cath 2011  . Hyperlipidemia   . Lung nodule   . Sleep apnea   . GERD (gastroesophageal reflux disease)   . S/P chemotherapy, time since greater than 12 weeks 1995     Chemoradiation at [facility] right upper lobe lung  . S/P radiation therapy 1995    right upper lobe lung  . Hx of radiation therapy 2/24, 2/26, 2/28, 3/5, 10/31/12    LLL lung- 60 gray in 5 fx, SBRT  . COPD (chronic obstructive pulmonary disease)   . Jaw sarcoma   . Lung cancer 1995   Past Surgical History  Procedure Laterality Date  . Vaginal hysterectomy    . Lung surgery    . Sarcoma      Jaw surgery  . Abdominal hysterectomy    . Lung biopsy     Social History:  History   Social History Narrative  . No narrative on file    Allergies  Allergen Reactions  . Fish Allergy Nausea And Vomiting  . Penicillins Hives and Itching    Family  History  Problem Relation Age of Onset  . Cancer Mother     mother  . Hypertension Mother   . Heart disease Mother   . Allergic rhinitis Mother   . Arthritis Mother      Prior to Admission medications   Medication Sig Start Date End Date Taking? Authorizing Provider  acetaminophen (TYLENOL) 500 MG tablet Take 1,000 mg by mouth every 6 (six) hours as needed.   Yes Historical Provider, MD  albuterol (PROVENTIL HFA;VENTOLIN HFA) 108 (90 BASE) MCG/ACT inhaler Inhale 1 puff into the lungs every 4 (four) hours as needed for wheezing or shortness of breath.   Yes Historical Provider, MD  alclomethasone (ACLOVATE) 0.05 % cream Apply 1 application topically 2 (two) times daily.  Under breast 11/01/12  Yes Historical Provider, MD  ALPRAZolam Duanne Moron) 0.5 MG tablet Take 0.5 mg by mouth 3 (three) times daily.   Yes Historical Provider, MD  aspirin 81 MG tablet Take 81 mg by mouth every morning.    Yes Historical Provider, MD  atenolol (TENORMIN) 25 MG tablet Take 25 mg by mouth every morning.  06/14/12  Yes Historical Provider, MD  Coenzyme Q10 (CO Q 10) 10 MG CAPS Take 1 capsule by mouth every morning.   Yes Historical Provider, MD  docusate sodium (COLACE) 100 MG capsule Take 100 mg by mouth once as needed.    Yes Historical Provider, MD  econazole nitrate 1 % cream Apply 1 application topically 2 (two) times daily. Under breast 11/01/12  Yes Historical Provider, MD  furosemide (LASIX) 20 MG tablet Take 20 mg by mouth daily as needed for fluid or edema (monday tuesday wednesday and thursday).  05/27/12  Yes Historical Provider, MD  levothyroxine (SYNTHROID, LEVOTHROID) 100 MCG tablet Take 100 mcg by mouth daily before breakfast.    Yes Historical Provider, MD  lisinopril (PRINIVIL,ZESTRIL) 10 MG tablet Take 10 mg by mouth 2 (two) times daily.  05/03/12  Yes Historical Provider, MD  mometasone-formoterol (DULERA) 100-5 MCG/ACT AERO Inhale 2 puffs into the lungs 2 (two) times daily. 02/14/14  Yes Kathee Delton, MD  omeprazole (PRILOSEC) 40 MG capsule Take 40 mg by mouth every morning.    Yes Historical Provider, MD  OVER THE COUNTER MEDICATION Take 1 capsule by mouth 2 (two) times daily. Super Strength EGCG  350 mg.   Yes Historical Provider, MD  rosuvastatin (CRESTOR) 40 MG tablet Take 40 mg by mouth every evening.    Yes Historical Provider, MD  tiotropium (SPIRIVA) 18 MCG inhalation capsule Place 1 capsule (18 mcg total) into inhaler and inhale daily. 09/28/13  Yes Kathee Delton, MD   Physical Exam: Filed Vitals:   04/17/14 1048 04/17/14 1049 04/17/14 1255  BP: 163/76  160/66  Pulse: 92  90  Temp: 98.2 F (36.8 C)    TempSrc: Oral    Resp: 25  16  SpO2: 88% 98% 100%      General:   Alert pleasant  Eyes:  extraocular movements intact  ENT:  moderate dentition  Neck:  soft supple no JVD no bruit  Cardiovascular:  S1-S2 regular rate rhythm  Respiratory:  clinically clear no added sound  Abdomen:  soft nontender no rebound  Skin:  trace lower extremity edema  Musculoskeletal:  range of motion intact, good strength  Psychiatric:  euthymic but anxious a little  Neurologic:  5/5 power extraocular movements 2/3 reflexes  Labs on Admission:  Basic Metabolic Panel:  Recent Labs Lab 04/17/14 1107 04/17/14 1117  NA 138 138  K 4.4 4.3  CL 97 95*  CO2 32  --   GLUCOSE 161* 154*  BUN 11 11  CREATININE 0.50 0.60  CALCIUM 9.9  --    Liver Function Tests:  Recent Labs Lab 04/17/14 1107  AST 12  ALT 9  ALKPHOS 71  BILITOT 0.4  PROT 7.7  ALBUMIN 3.7   No results found for this basename: LIPASE, AMYLASE,  in the last 168 hours No results found for this basename: AMMONIA,  in the last 168 hours CBC:  Recent Labs Lab 04/17/14 1107 04/17/14 1117  WBC 8.6  --   NEUTROABS 5.5  --   HGB 11.3* 12.9  HCT 35.3* 38.0  MCV 91.5  --   PLT 397  --    Cardiac Enzymes:  Recent Labs Lab 04/17/14 1107  TROPONINI <0.30    BNP (last 3 results)  Recent Labs  01/29/14 1513  PROBNP 81.4   CBG: No results found for this basename: GLUCAP,  in the last 168 hours  Radiological Exams on Admission: Dg Chest 2 View  04/17/2014   CLINICAL DATA:  Shortness of breath, productive cough, history of right lung cancer  EXAM: CHEST  2 VIEW  COMPARISON:  Chest radiograph dated 01/29/2014. CT chest dated 12/26/2013.  FINDINGS: Postsurgical changes with volume loss in the right upper lung. Underlying chronic interstitial markings/emphysematous changes. No pleural effusion or pneumothorax.  The heart is normal in size.  Degenerative changes of the visualized thoracolumbar spine. New complete compression fracture deformity of an upper/mid thoracic  vertebral body, likely T6, new from prior CT.  IMPRESSION: Postsurgical changes in the right upper hemithorax.  No evidence of acute cardiopulmonary disease.  Near complete compression fracture deformity of an upper/mid thoracic vertebral body, likely T6, new from prior CT.   Electronically Signed   By: Julian Hy M.D.   On: 04/17/2014 11:27   Ct Angio Chest W/cm &/or Wo Cm  04/17/2014   CLINICAL DATA:  Acute onset of pressure in the mid chest radiating to the back earlier today. Prior right upper lobectomy in 1995 for lung cancer. Diagnosis of adenocarcinoma in the superior segment left lower lobe in January, 2014. Prior history of sarcoma of the jaw. Current history of hypertension and hypercholesterolemia. Current smoker.  EXAM: CT ANGIOGRAPHY CHEST WITH CONTRAST  TECHNIQUE: Multidetector CT imaging of the chest was performed using the standard protocol before and during bolus administration of intravenous contrast. Multiplanar CT image reconstructions and MIPs were obtained to evaluate the vascular anatomy.  CONTRAST:  185mL OMNIPAQUE IOHEXOL 350 MG/ML IV.  COMPARISON:  CTA chest 12/26/2013, 02/06/2013. CT chest 08/26/2012, 01/29/2012.  FINDINGS: Unenhanced images demonstrate no evidence of mural hematoma involving the thoracic or upper abdominal aorta. Moderate to severe atherosclerotic calcification of the aorta and the visualized proximal great vessels. Moderate atherosclerosis involving the left main, LAD and right coronary arteries.  Enhanced images demonstrate no evidence of aortic dissection or aneurysm. Moderate noncalcified plaque is present involving the visualized thoracic and upper abdominal aorta, without a large noncalcified plaque involving the left lateral wall of the distal thoracic aorta. Heart mildly enlarged. No pericardial effusion. Prominent epicardial fat. Central pulmonary arteries patent.  Right upper lobectomy. Post radiation pleuroparenchymal scarring and bronchiectasis in  the remaining right middle lobe and superior segment right lower lobe, unchanged. Post radiation scarring and bronchiectasis medially in the left lung apex, unchanged. Likely post radiation pneumonitis/ fibrosis in the superior segment right lower lobe at the  site of biopsy-proven adenocarcinoma, with a residual 7 mm nodule, unchanged since the examination 3 months ago. Hyperlucency throughout the remaining right lung, unchanged. Emphysematous changes throughout the left lung, unchanged. New scattered ground-glass opacities throughout the right lower lobe. No new pulmonary parenchymal abnormalities elsewhere.  Interval increase in size of a soft tissue mass in the right hilum, current measurements approximating 2.4 x 2.6 cm (series 6, image 38), associated with narrowing of the right middle lobe and right lower lobe bronchi and several of their segmental branches. Stable approximate 2.4 x 1.2 cm upper right paratracheal lymph node (image 17). No new or enlarging lymphadenopathy elsewhere.  Diffuse steatosis throughout the visualized liver without focal hepatic parenchymal abnormality. Pancreatic atrophy again noted. Parapelvic cysts involving the visualized left kidney. Visualized upper abdomen otherwise unremarkable. Bone window images demonstrate severe generalized osseous demineralization with a new compression fracture of T7 which is on the order of 75% or so, without significant retropulsion.  Review of the MIP images confirms the above findings.  IMPRESSION: 1. No evidence of thoracic or upper abdominal aortic aneurysm or dissection. 2. Severe aortic atherosclerosis with a large noncalcified plaque involving the left lateral wall of the distal thoracic aorta. This places the patient at risk for embolic disease. 3. Enlarging mass in the right hilum, measurements given above, either progressive lymphadenopathy or recurrent tumor centrally in the remaining right middle lobe lobe in this patient with prior right  upper lobectomy. Stable mildly enlarged upper right paratracheal lymph node since the examination 3 months ago. No evidence of recurrent or metastatic disease elsewhere. 4. Post radiation fibrosis and bronchiectasis in the right middle lobe and superior segment right lower lobe. 5. Post radiation pneumonitis/fibrosis at the site of biopsy-proven adenocarcinoma in the superior segment left lower lobe. Stable residual 7 mm nodule since the examination 3 months ago. 6. Diffuse steatosis involving the visualized liver without focal hepatic parenchymal abnormality. 7. New approximate 75% compression fracture of T7 since examination 3 months ago, likely osteoporotic.   Electronically Signed   By: Evangeline Dakin M.D.   On: 04/17/2014 13:16    EKG: Independently reviewed.  sinus rhythm, baseline wonder prolonged PR interval 0.22, QRS axis 45 no ST-T wave changes  Assessment/Plan Principal Problem:   Chest pain-unclear etiology.  Heart score 3-4. When I saw her she claims she had chest pressure and they gave her 2 nitroglycerin which helped relieve some of her pain and her blood pressure came down with but she may have very well some unstable angina. He was cycled cardiac enzymes and I will place a nitroglycerin paste on her. I have asked cardiology to see her she had a cath in 2011 showing minimal coronary artery disease. I suspect that she has other issues going on as well which are contributory to her pain that she states her pain is more in her middle back. I am not sure if he IR consult if feasible for balloon kyphoplasty of the T7 vertebra and we'll defer this to rounding physician in the a.m., Once cardiology determined there is no procedure day 1 initially  Active Problems:   CAD (coronary artery disease)-see discussion    Dyslipidemia-we will consider continuing her home dose 40 mg Crestor.  she has lipid panel that is been ordered for her stratification    COPD (chronic obstructive pulmonary  disease) with emphysema-we will continue her home medications of albuterol 1 puff every 4 when necessary , Advair equal and 2 puffs twice a day, Spiriva once  daily    Lung nodule-   Obstructive sleep apnea, adult-she will need to continue her BiPAP Malignant neoplasm of lower lobe, bronchus, or lung-unfortunately the hilar mass maybe considered a recurrence of lung cancer. Once acute pain is sort of go in terms of etiology it may be reasonable to give her oncologist some indication as to having this followed up in the outpatient   Hx of radiation therapy   Smoker-she continues to smoke and has cut back to one pack day. Congratulated . It is unlikely that she will ever quite   Admit to telemetry Cardiology consult No family at bedside Presumed full code  Time spent: [place], [facility] Pager [phone]  If 7PM-7AM, please contact night-coverage [email or web address] Password [facility] 04/17/2014, 2:19 PM

## 2014-04-17 NOTE — ED Notes (Signed)
Pt give second dose of nitroglycerin SL .4mg  per verbal order Dr. Alinda Dooms

## 2014-04-17 NOTE — Telephone Encounter (Signed)
Called patient to ask about cancelling MRI, lvm for a return call.

## 2014-04-17 NOTE — ED Notes (Signed)
Pt given nitroglycerin SL tablet 0.4mg  per verbal order Dr. Alinda Dooms.

## 2014-04-17 NOTE — Progress Notes (Signed)
Patient refuses to use cpap or bipap. Does not use at home . Reported to nursing and nite. Shift RT

## 2014-04-17 NOTE — Consult Note (Signed)
CARDIOLOGY CONSULT NOTE  Patient ID: Alexandria Warren MRN: [id number] DOB/AGE: 05-11-1934 80 y.o.  Admit date: 04/17/2014 Primary Physician [name], MD Primary Cardiologist   Dr. Percival Spanish Chief Complaint  Chest pressure  HPI:  The patient has a history of mild nonobstructive coronary plaque on cath in 2011.  She had a negative stress test in 2013 with a normal EF.   She presented for evaluation of chest pressure. This happened last night. She felt pressure to the right through to her back. She got this was unlike the discomfort that is 06/27/2012. There was no pain. This has no jaw or arm discomfort. Have any acute shortness of breath although she short of breath. She was diaphoretic and flushed in her face. She took her lisinopril. She said it lasted for about 2 hours going away spontaneously. She had the same discomfort again about 4 AM. She is otherwise not having a discomfort in the days prior to this.  She has not had any new PND or orthopnea. She has severe chronic lung disease and uses oxygen 24/ 7 except when she smoking.  Of note the patient has been having some hematemesis recently  Work up this admission included a chest CT as below with probable new thoracic compression fracture and probable new hilar lung mass compared with previous.     Past Medical History  Diagnosis Date  . Thyroid disease   . Hypertension   . Coronary artery disease     Mild on cath 2011  . Hyperlipidemia   . Lung nodule   . Sleep apnea   . GERD (gastroesophageal reflux disease)   . S/P chemotherapy, time since greater than 12 weeks 1995     Chem[facility]adiation at [facility] right upper lobe lung  . S/P radiation therapy 1995    right upper lobe lung  . Hx of radiation therapy 2/24, 2/26, 2/28, 3/5, 10/31/12    LLL lung- 60 gray in 5 fx, SBRT  . COPD (chronic obstructive pulmonary disease)   . Jaw sarcoma   . Lung cancer 1995    Past Surgical History  Procedure Laterality Date  . Vaginal hysterectomy     . Lung surgery    . Sarcoma      Jaw surgery  . Abdominal hysterectomy    . Lung biopsy      Allergies  Allergen Reactions  . Fish Allergy Nausea And Vomiting  . Penicillins Hives and Itching   Prescriptions prior to admission  Medication Sig Dispense Refill  . acetaminophen (TYLENOL) 500 MG tablet Take 1,000 mg by mouth every 6 (six) hours as needed.      Marland Kitchen albuterol (PROVENTIL HFA;VENTOLIN HFA) 108 (90 BASE) MCG/ACT inhaler Inhale 1 puff into the lungs every 4 (four) hours as needed for wheezing or shortness of breath.      Marland Kitchen alclomethasone (ACLOVATE) 0.05 % cream Apply 1 application topically 2 (two) times daily. Under breast      . ALPRAZolam (XANAX) 0.5 MG tablet Take 0.5 mg by mouth 3 (three) times daily.      Marland Kitchen aspirin 81 MG tablet Take 81 mg by mouth every morning.       Marland Kitchen atenolol (TENORMIN) 25 MG tablet Take 25 mg by mouth every morning.       . Coenzyme Q10 (CO Q 10) 10 MG CAPS Take 1 capsule by mouth every morning.      . docusate sodium (COLACE) 100 MG capsule Take 100 mg by mouth once as  needed.       Marland Kitchen econazole nitrate 1 % cream Apply 1 application topically 2 (two) times daily. Under breast      . furosemide (LASIX) 20 MG tablet Take 20 mg by mouth daily as needed for fluid or edema (monday tuesday wednesday and thursday).       Marland Kitchen levothyroxine (SYNTHROID, LEVOTHROID) 100 MCG tablet Take 100 mcg by mouth daily before breakfast.       . lisinopril (PRINIVIL,ZESTRIL) 10 MG tablet Take 10 mg by mouth 2 (two) times daily.       . mometasone-formoterol (DULERA) 100-5 MCG/ACT AERO Inhale 2 puffs into the lungs 2 (two) times daily.  1 Inhaler  6  . omeprazole (PRILOSEC) 40 MG capsule Take 40 mg by mouth every morning.       Marland Kitchen OVER THE COUNTER MEDICATION Take 1 capsule by mouth 2 (two) times daily. Super Strength EGCG  350 mg.      . rosuvastatin (CRESTOR) 40 MG tablet Take 40 mg by mouth every evening.       . tiotropium (SPIRIVA) 18 MCG inhalation capsule Place 1 capsule  (18 mcg total) into inhaler and inhale daily.  30 capsule  6   Family History  Problem Relation Age of Onset  . Cancer Mother     mother  . Hypertension Mother   . Heart disease Mother   . Allergic rhinitis Mother   . Arthritis Mother     History   Social History  . Marital Status: Widowed    Spouse Name: N/A    Number of Children: N/A  . Years of Education: N/A   Occupational History  . retired    Social History Main Topics  . Smoking status: Current Every Day Smoker -- 1.00 packs/day for 62 years    Types: Cigarettes, Cigars  . Smokeless tobacco: Not on file     Comment: 5-6 cigs a day  . Alcohol Use: No  . Drug Use: No  . Sexual Activity: Not on file   Other Topics Concern  . Not on file   Social History Narrative  . No narrative on file     ROS:    As stated in the HPI and negative for all other systems.  Physical Exam: Blood pressure 155/59, pulse 94, temperature 97.7 F (36.5 C), temperature source Oral, resp. rate 20, height 5\' 9"  (1.753 m), weight 194 lb (87.998 kg), SpO2 97.00%.  GENERAL:  Well appearing HEENT:  Pupils equal round and reactive, fundi not visualized, oral mucosa unremarkable NECK:  No jugular venous distention, waveform within normal limits, carotid upstroke brisk and symmetric, bilateral bruits, no thyromegaly LYMPHATICS:  No cervical, inguinal adenopathy LUNGS:  Reduced heart sounds right upper lung  BACK:  No CVA tenderness CHEST:  Unremarkable HEART:  PMI not displaced or sustained,S1 and S2 within normal limits, no S3, no S4, no clicks, no rubs, no murmurs, distant heart sounds ABD:  Flat, positive bowel sounds normal in frequency in pitch, no bruits, no rebound, no guarding, no midline pulsatile mass, no hepatomegaly, no splenomegaly EXT:  2 plus pulses throughout, no edema, no cyanosis no clubbing SKIN:  No rashes no nodules NEURO:  Cranial nerves II through XII grossly intact, motor grossly intact throughout PSYCH:  Cognitively  intact, oriented to person place and time  Labs: Lab Results  Component Value Date   BUN 11 04/17/2014   Lab Results  Component Value Date   CREATININE 0.60 04/17/2014   Lab  Results  Component Value Date   NA 138 04/17/2014   K 4.3 04/17/2014   CL 95* 04/17/2014   CO2 32 04/17/2014   Lab Results  Component Value Date   TROPONINI <0.30 04/17/2014   Lab Results  Component Value Date   WBC 8.6 04/17/2014   HGB 12.9 04/17/2014   HCT 38.0 04/17/2014   MCV 91.5 04/17/2014   PLT 397 04/17/2014    Lab Results  Component Value Date   ALT 9 04/17/2014   AST 12 04/17/2014   ALKPHOS 71 04/17/2014   BILITOT 0.4 04/17/2014     Radiology:   CXR: Postsurgical changes in the right upper hemithorax.  No evidence of acute cardiopulmonary disease.  Near complete compression fracture deformity of an upper/mid  thoracic vertebral body, likely T6, new from prior CT.   EKG:  NSR, rate 91, axis WNL, intervals WNL, no acute ST T wave changes.  04/17/2014  ASSESSMENT AND PLAN:   CHEST PRESSURE:   There are atypical greater than typical features.   At this point given the previous negative workups I would suggest that if there is no objective evidence of ischemia with recurrent chest discomfort, enzyme elevation or EKG changes but no further inpatient cardiac workup would be needed. I will defer workup of the  pulmonary mass to the primary team.   CAROTID STENOSIS:  She is overdue for followup of this and this can be arranged as an outpatient.  TOBACCO ABUSE:  She has cut back but unfortunately he is severely addicted. She has been injected.  SignedMinus Breeding 04/17/2014, 5:19 PM

## 2014-04-17 NOTE — Telephone Encounter (Signed)
CALLED PATIENT TO ASK QUESTION, LVM FOR A RETURN CALL 

## 2014-04-17 NOTE — Telephone Encounter (Signed)
XXXX 

## 2014-04-17 NOTE — ED Notes (Signed)
Pt states that started having chest pressure "not pain" in middle of her chest then moved to her back.  Pt states that she was sweating and had lightheadedness last night and again this morning when she got up.  Pt called her PCP this morning but was told to come to ED with her symptoms.   Pt does smoke, has HTN, high cholesterol and borderline diabetic.  Pt is on Oxygen continuous via nasal canula 3L.

## 2014-04-17 NOTE — Progress Notes (Signed)
ANTICOAGULATION CONSULT NOTE - Initial Consult  Pharmacy Consult for UFH  Indication: chest pain/ACS  Allergies  Allergen Reactions  . Fish Allergy Nausea And Vomiting  . Penicillins Hives and Itching    Patient Measurements: Height: 5\' 9"  (175.3 cm) Weight: 194 lb (87.998 kg) IBW/kg (Calculated) : 66.2 Heparin Dosing Weight: 85 kg  Vital Signs: Temp: 97.7 F (36.5 C) (08/25 1639) Temp src: Oral (08/25 1639) BP: 155/59 mmHg (08/25 1639) Pulse Rate: 94 (08/25 1639)  Labs:  Recent Labs  04/17/14 1107 04/17/14 1117  HGB 11.3* 12.9  HCT 35.3* 38.0  PLT 397  --   CREATININE 0.50 0.60  TROPONINI <0.30  --     Estimated Creatinine Clearance: 68.5 ml/min (by C-G formula based on Cr of 0.6).   Medical History: Past Medical History  Diagnosis Date  . Thyroid disease   . Hypertension   . Coronary artery disease     Mild on cath 2011  . Hyperlipidemia   . Lung nodule   . Sleep apnea   . GERD (gastroesophageal reflux disease)   . S/P chemotherapy, time since greater than 12 weeks 1995     Chemoradiation at [facility] right upper lobe lung  . S/P radiation therapy 1995    right upper lobe lung  . Hx of radiation therapy 2/24, 2/26, 2/28, 3/5, 10/31/12    LLL lung- 60 gray in 5 fx, SBRT  . COPD (chronic obstructive pulmonary disease)   . Jaw sarcoma   . Lung cancer 1995    Medications:  Scheduled:  . ALPRAZolam  0.5 mg Oral TID  . aspirin  324 mg Oral NOW   Or  . aspirin  300 mg Rectal NOW  . [START ON 04/18/2014] aspirin EC  81 mg Oral Daily  . aspirin  325 mg Oral Daily  . [START ON 04/18/2014] atenolol  25 mg Oral q morning - 10a  . atorvastatin  10 mg Oral q1800  . heparin  4,000 Units Intravenous Once  . [START ON 04/18/2014] levothyroxine  100 mcg Oral QAC breakfast  . mometasone-formoterol  2 puff Inhalation BID  . nitroGLYCERIN  0.3 mg Transdermal Daily  . nitroGLYCERIN      . [START ON 04/18/2014] pantoprazole  40 mg Oral Daily  . tiotropium  18 mcg  Inhalation Daily   Infusions:  . heparin      Assessment: 80yoF admitted for pain between shoulder blades.  Episode of chest pain/diaphoresis/elevated BP yesterday, not relieved by Xanax and BP meds.  Does have hx T7 fracture in affected area.  No PTA anticoagulation   CBC wnl  No baseline aPTT/INR  Good renal function  Goal of Therapy:  Heparin level 0.3-0.7 units/ml Monitor platelets by anticoagulation protocol: Yes   Plan:   Baseline aPTT/INR ordered  UFH bolus 4000 units x1  Start heparin infusion at 1100 units/hr (13 units/kg/hr)  Check anti-Xa level in 8 hrs and daily while on heparin  Daily CBC  Cardiology consulted; duration will depend on ACS treatment  Reuel Boom, PharmD [phone] 04/17/2014 6:23 PM

## 2014-04-17 NOTE — ED Provider Notes (Signed)
CSN: [id number]     Arrival date & time 04/17/14  1035 History   First MD Initiated Contact with Patient 04/17/14 1054     Chief Complaint  Patient presents with  . Chest Pain     (Consider location/radiation/quality/duration/timing/severity/associated sxs/prior Treatment) HPI Comments: Patient here complaining of some onset of substernal pressure radiating to her back which began yesterday. Symptoms last for several hours and patient noted that her blood pressure was elevated and she took an extra dose of her antihypertensive and symptoms improved. Symptoms returned hours later and woke her from her sleep it radiated between her shoulder blades. She noted associated diaphoresis and dyspnea with this. Does have a history of COPD and is on home oxygen chronically. No recent fever or cough. Does note some lower extremity weakness but denies any upper extremity weakness. Symptoms are persistent and nothing makes them better or worse. No prior history of same  Patient is a 80 y.o. female presenting with chest pain. The history is provided by the patient and a relative.  Chest Pain   Past Medical History  Diagnosis Date  . Thyroid disease   . Hypertension   . Coronary artery disease     Mild on cath 2011  . Hyperlipidemia   . Lung nodule   . Sleep apnea   . GERD (gastroesophageal reflux disease)   . S/P chemotherapy, time since greater than 12 weeks 1995     Chemoradiation at [facility] right upper lobe lung  . S/P radiation therapy 1995    right upper lobe lung  . Hx of radiation therapy 2/24, 2/26, 2/28, 3/5, 10/31/12    LLL lung- 60 gray in 5 fx, SBRT  . COPD (chronic obstructive pulmonary disease)   . Jaw sarcoma   . Lung cancer 1995   Past Surgical History  Procedure Laterality Date  . Vaginal hysterectomy    . Lung surgery    . Sarcoma      Jaw surgery  . Abdominal hysterectomy    . Lung biopsy     Family History  Problem Relation Age of Onset  . Cancer Mother     mother  .  Hypertension Mother   . Heart disease Mother   . Allergic rhinitis Mother   . Arthritis Mother    History  Substance Use Topics  . Smoking status: Current Every Day Smoker -- 1.00 packs/day for 62 years    Types: Cigarettes, Cigars  . Smokeless tobacco: Not on file     Comment: 5-6 cigs a day  . Alcohol Use: No   OB History   Grav Para Term Preterm Abortions TAB SAB Ect Mult Living                 Review of Systems  Cardiovascular: Positive for chest pain.  All other systems reviewed and are negative.     Allergies  Fish allergy and Penicillins  Home Medications   Prior to Admission medications   Medication Sig Start Date End Date Taking? Authorizing Provider  acetaminophen (TYLENOL) 500 MG tablet Take 1,000 mg by mouth every 6 (six) hours as needed.    Historical Provider, MD  alclomethasone (ACLOVATE) 0.05 % cream Apply 1 application topically 2 (two) times daily. Under breast 11/01/12   Historical Provider, MD  aspirin 81 MG tablet Take 81 mg by mouth every morning.     Historical Provider, MD  atenolol (TENORMIN) 25 MG tablet Take 25 mg by mouth every morning.  06/14/12  Historical Provider, MD  ciprofloxacin (CIPRO) 250 MG tablet Take 1 tablet (250 mg total) by mouth 2 (two) times daily. One po bid x 5 days 01/29/14   Ephraim Hamburger, MD  citalopram (CELEXA) 20 MG tablet Take 20 mg by mouth daily.  03/07/13   Historical Provider, MD  docusate sodium (COLACE) 100 MG capsule Take 100 mg by mouth 2 (two) times daily.    Historical Provider, MD  econazole nitrate 1 % cream Apply 1 application topically 2 (two) times daily. Under breast 11/01/12   Historical Provider, MD  furosemide (LASIX) 20 MG tablet Take 20 mg by mouth daily as needed for fluid or edema (monday tuesday wednesday and thursday).  05/27/12   Historical Provider, MD  levothyroxine (SYNTHROID, LEVOTHROID) 100 MCG tablet Take 100 mcg by mouth daily before breakfast.     Historical Provider, MD  lisinopril  (PRINIVIL,ZESTRIL) 10 MG tablet Take 10 mg by mouth 2 (two) times daily.  05/03/12   Historical Provider, MD  LORazepam (ATIVAN) 1 MG tablet Take 0.5-1 mg by mouth 2 (two) times daily as needed for anxiety.     Historical Provider, MD  mometasone-formoterol (DULERA) 100-5 MCG/ACT AERO Inhale 2 puffs into the lungs 2 (two) times daily. 02/14/14   Kathee Delton, MD  omeprazole (PRILOSEC) 40 MG capsule Take 40 mg by mouth every morning.     Historical Provider, MD  rosuvastatin (CRESTOR) 40 MG tablet Take 40 mg by mouth every evening.     Historical Provider, MD  tiotropium (SPIRIVA) 18 MCG inhalation capsule Place 1 capsule (18 mcg total) into inhaler and inhale daily. 09/28/13   Kathee Delton, MD   BP 163/76  Pulse 92  Temp(Src) 98.2 F (36.8 C) (Oral)  Resp 25  SpO2 98% Physical Exam  Nursing note and vitals reviewed. Constitutional: She is oriented to person, place, and time. She appears well-developed and well-nourished.  Non-toxic appearance. No distress.  HENT:  Head: Normocephalic and atraumatic.  Eyes: Conjunctivae, EOM and lids are normal. Pupils are equal, round, and reactive to light.  Neck: Normal range of motion. Neck supple. No tracheal deviation present. No mass present.  Cardiovascular: Normal rate, regular rhythm and normal heart sounds.  Exam reveals no gallop.   No murmur heard. Pulmonary/Chest: Effort normal and breath sounds normal. No stridor. No respiratory distress. She has no decreased breath sounds. She has no wheezes. She has no rhonchi. She has no rales.  Abdominal: Soft. Normal appearance and bowel sounds are normal. She exhibits no distension. There is no tenderness. There is no rebound and no CVA tenderness.  Musculoskeletal: Normal range of motion. She exhibits no edema and no tenderness.  Neurological: She is alert and oriented to person, place, and time. She has normal strength. No cranial nerve deficit or sensory deficit. GCS eye subscore is 4. GCS verbal  subscore is 5. GCS motor subscore is 6.  Skin: Skin is warm and dry. No abrasion and no rash noted.  Psychiatric: She has a normal mood and affect. Her speech is normal and behavior is normal.    ED Course  Procedures (including critical care time) Labs Review Labs Reviewed  TROPONIN I  CBC WITH DIFFERENTIAL  COMPREHENSIVE METABOLIC PANEL  I-STAT CHEM 8, ED    Imaging Review No results found.   EKG Interpretation   Date/Time:  Tuesday April 17 2014 10:44:48 EDT Ventricular Rate:  91 PR Interval:  269 QRS Duration: 96 QT Interval:  380 QTC Calculation: 467  R Axis:   23 Text Interpretation:  Sinus rhythm Prolonged PR interval Abnormal R-wave  progression, early transition Baseline wander in lead(s) V1 No significant  change since last tracing Confirmed by Ken Bonn  MD, Rayne Cowdrey ([id number]) on  04/17/2014 11:12:08 AM      MDM   Final diagnoses:  None   Patient currently pain-free at this time. Results reviewed with her and patient will be admitted to the hospitalist for evaluation of her chest pain.     Leota Jacobsen, MD 04/17/14 [phone]

## 2014-04-17 NOTE — ED Notes (Signed)
Attempt to call report to the floor and nurse was unable to take report. Nurse is to call ED for report.

## 2014-04-18 ENCOUNTER — Ambulatory Visit
Admission: RE | Admit: 2014-04-18 | Discharge: 2014-04-18 | Disposition: A | Discharge status: Home / Self Care | Payer: Medicare Other | Source: Ambulatory Visit | Attending: Radiation Oncology | Admitting: Radiation Oncology

## 2014-04-18 ENCOUNTER — Encounter: Payer: Self-pay | Admitting: Radiation Oncology

## 2014-04-18 DIAGNOSIS — C3430 Malignant neoplasm of lower lobe, unspecified bronchus or lung: Secondary | ICD-10-CM

## 2014-04-18 DIAGNOSIS — F17200 Nicotine dependence, unspecified, uncomplicated: Secondary | ICD-10-CM

## 2014-04-18 DIAGNOSIS — J438 Other emphysema: Secondary | ICD-10-CM | POA: Diagnosis not present

## 2014-04-18 DIAGNOSIS — R0789 Other chest pain: Secondary | ICD-10-CM

## 2014-04-18 DIAGNOSIS — I2510 Atherosclerotic heart disease of native coronary artery without angina pectoris: Secondary | ICD-10-CM

## 2014-04-18 DIAGNOSIS — R079 Chest pain, unspecified: Secondary | ICD-10-CM | POA: Diagnosis not present

## 2014-04-18 DIAGNOSIS — E785 Hyperlipidemia, unspecified: Secondary | ICD-10-CM | POA: Diagnosis not present

## 2014-04-18 LAB — LIPID PANEL
Cholesterol: 135 mg/dL (ref 0–200)
HDL: 44 mg/dL (ref >39)
LDL Cholesterol: 67 mg/dL (ref 0–99)
Total CHOL/HDL Ratio: 3.1 RATIO
Triglycerides: 121 mg/dL (ref <150)
VLDL: 24 mg/dL (ref 0–40)

## 2014-04-18 LAB — BASIC METABOLIC PANEL
Anion gap: 12 (ref 5–15)
BUN: 15 mg/dL (ref 6–23)
CHLORIDE: 94 meq/L — AB (ref 96–112)
CO2: 31 mEq/L (ref 19–32)
Calcium: 9.2 mg/dL (ref 8.4–10.5)
Creatinine, Ser: 0.62 mg/dL (ref 0.50–1.10)
GFR calc non Af Amer: 84 mL/min — ABNORMAL LOW (ref >90)
Glucose, Bld: 181 mg/dL — ABNORMAL HIGH (ref 70–99)
POTASSIUM: 3.8 meq/L (ref 3.7–5.3)
Sodium: 137 mEq/L (ref 137–147)

## 2014-04-18 LAB — CBC
HEMATOCRIT: 34.1 % — AB (ref 36.0–46.0)
Hemoglobin: 10.8 g/dL — ABNORMAL LOW (ref 12.0–15.0)
MCH: 29.7 pg (ref 26.0–34.0)
MCHC: 31.7 g/dL (ref 30.0–36.0)
MCV: 93.7 fL (ref 78.0–100.0)
Platelets: 327 10*3/uL (ref 150–400)
RBC: 3.64 MIL/uL — AB (ref 3.87–5.11)
RDW: 13.7 % (ref 11.5–15.5)
WBC: 8.2 10*3/uL (ref 4.0–10.5)

## 2014-04-18 LAB — TSH: TSH: 1.4 u[IU]/mL (ref 0.350–4.500)

## 2014-04-18 LAB — HEPARIN LEVEL (UNFRACTIONATED): Heparin Unfractionated: 0.48 IU/mL (ref 0.30–0.70)

## 2014-04-18 MED ORDER — ENOXAPARIN SODIUM 40 MG/0.4ML ~~LOC~~ SOLN
40.0000 mg | SUBCUTANEOUS | Status: DC
  Administered 2014-04-18: 40 mg via SUBCUTANEOUS
  Filled 2014-04-18 (×2): qty 0.4

## 2014-04-18 NOTE — Progress Notes (Addendum)
     SUBJECTIVE: Nausea this am, now resolved. No chest pain or SOB.   BP 121/60  Pulse 91  Temp(Src) 97.8 F (36.6 C) (Oral)  Resp 18  Ht 5\' 9"  (1.753 m)  Wt 194 lb (87.998 kg)  BMI 28.64 kg/m2  SpO2 98%  Intake/Output Summary (Last 24 hours) at 04/18/14 9629 Last data filed at 04/18/14 0200  Gross per 24 hour  Intake 414.08 ml  Output      0 ml  Net 414.08 ml    PHYSICAL EXAM General: Well developed, well nourished, in no acute distress. Alert and oriented x 3.  Psych:  Good affect, responds appropriately Neck: No JVD. No masses noted.  Lungs: Rhonci bilaterally  Heart: RRR with no murmurs noted. Abdomen: Bowel sounds are present. Soft, non-tender.  Extremities: No lower extremity edema.   LABS: Basic Metabolic Panel:  Recent Labs  04/17/14 1724 04/18/14 0118  NA 140 137  K 4.2 3.8  CL 98 94*  CO2 29 31  GLUCOSE 121* 181*  BUN 13 15  CREATININE 0.64 0.62  CALCIUM 9.7 9.2   CBC:  Recent Labs  04/17/14 1107 04/17/14 1117 04/18/14 0118  WBC 8.6  --  8.2  NEUTROABS 5.5  --   --   HGB 11.3* 12.9 10.8*  HCT 35.3* 38.0 34.1*  MCV 91.5  --  93.7  PLT 397  --  327   Cardiac Enzymes:  Recent Labs  04/17/14 1107  TROPONINI <0.30   Fasting Lipid Panel:  Recent Labs  04/18/14 0118  CHOL 135  HDL 44  LDLCALC 67  TRIG 121  CHOLHDL 3.1    Current Meds: . ALPRAZolam  0.5 mg Oral TID  . aspirin  324 mg Oral NOW   Or  . aspirin  300 mg Rectal NOW  . aspirin EC  81 mg Oral Daily  . aspirin  325 mg Oral Daily  . atenolol  25 mg Oral q morning - 10a  . atorvastatin  10 mg Oral q1800  . levothyroxine  100 mcg Oral QAC breakfast  . mometasone-formoterol  2 puff Inhalation BID  . nitroGLYCERIN  0.3 mg Transdermal Daily  . pantoprazole  40 mg Oral Daily  . tiotropium  18 mcg Inhalation Daily     ASSESSMENT AND PLAN:  1. CAD: Mild CAD by cardiac cath 2011. Her chest pain has mostly atypical features. See full consult note yesterday by Dr.  Percival Spanish. Troponin negative. EKG without ischemia changes. No further inpatient cardiac workup necessary. She can follow up with Dr. Percival Spanish in 3-4 weeks.   2. Pulmonary mass: per primary team  3. Carotid artery disease: Outpatient follow with dopplers.   4. Tobacco abuse: Smoking cessation advised.   Will sign off. Please call with questions.    Travonne Schowalter  8/26/20157:04 AM

## 2014-04-18 NOTE — Progress Notes (Signed)
PROGRESS NOTE  Alexandria Warren VOZ:[id number] DOB: 05/17/1934 DOA: 04/17/2014 PCP: [name], MD  Subjective/ 24 H Interval events - feeling better this morning  Assessment/Plan: Chest pressure - appreciate cardiology input, does not seem cardiac. CEs negative, discontinue heparin. This may be related to her underlying malignancy.  CAD (coronary artery disease) - no chest pain, cardiology evaluated patient while hospitalized.  Dyslipidemia - continue statin T7 compression fracture - minimal pain, conservative management COPD (chronic obstructive pulmonary disease) with emphysema-we will continue her home medications of albuterol 1 puff every 4 when necessary , Advair equal and 2 puffs twice a day, Spiriva once daily  Obstructive sleep apnea, adult - she will need to continue her BiPAP  Malignant neoplasm of lower lobe, bronchus, or lung-unfortunately the hilar mass maybe considered a recurrence of lung cancer. I discussed with Dr. Sondra Come and he will see patient in consultation. Appreciate input. Patient unclear at this time if she wishes to pursue more workup or treatment but wants to think about it. Hx of radiation therapy  Smoker - she continues to smoke and has cut back to one pack day.    Diet: heart Fluids: none DVT Prophylaxis: Lovenox  Code Status: Full Family Communication: d/w patient  Disposition Plan: home when ready, hopefully tomorrow once [facility] evaluates  Consultants:  Radiation Oncology   Procedures:  None    Antibiotics None  Studies  Filed Vitals:   04/17/14 1712 04/17/14 2154 04/18/14 0320 04/18/14 0644  BP:  118/62 115/58 121/60  Pulse:  97 93 91  Temp:  97.6 F (36.4 C) 98.9 F (37.2 C) 97.8 F (36.6 C)  TempSrc:  Oral Oral Oral  Resp:  18 20 18   Height:      Weight:      SpO2: 97% 98% 98% 98%    Intake/Output Summary (Last 24 hours) at 04/18/14 0744 Last data filed at 04/18/14 0700  Gross per 24 hour  Intake 502.08 ml    Output      0 ml  Net 502.08 ml   Filed Weights   04/17/14 1639  Weight: 87.998 kg (194 lb)    Exam:  General:  NAD  Cardiovascular: RRR  Respiratory: CTA biL  Abdomen: soft, non tender  MSK: no edema  Data Reviewed: Basic Metabolic Panel:  Recent Labs Lab 04/17/14 1107 04/17/14 1117 04/17/14 1724 04/18/14 0118  NA 138 138 140 137  K 4.4 4.3 4.2 3.8  CL 97 95* 98 94*  CO2 32  --  29 31  GLUCOSE 161* 154* 121* 181*  BUN 11 11 13 15   CREATININE 0.50 0.60 0.64 0.62  CALCIUM 9.9  --  9.7 9.2   Liver Function Tests:  Recent Labs Lab 04/17/14 1107 04/17/14 1724  AST 12 11  ALT 9 8  ALKPHOS 71 67  BILITOT 0.4 0.3  PROT 7.7 7.2  ALBUMIN 3.7 3.5   CBC:  Recent Labs Lab 04/17/14 1107 04/17/14 1117 04/18/14 0118  WBC 8.6  --  8.2  NEUTROABS 5.5  --   --   HGB 11.3* 12.9 10.8*  HCT 35.3* 38.0 34.1*  MCV 91.5  --  93.7  PLT 397  --  327   Cardiac Enzymes:  Recent Labs Lab 04/17/14 1107  TROPONINI <0.30   BNP (last 3 results)  Recent Labs  01/29/14 1513 04/17/14 1521 04/17/14 1723  PROBNP 81.4 105.1 5.5   Studies: Dg Chest 2 View  04/17/2014   CLINICAL DATA:  Shortness of  breath, productive cough, history of right lung cancer  EXAM: CHEST  2 VIEW  COMPARISON:  Chest radiograph dated 01/29/2014. CT chest dated 12/26/2013.  FINDINGS: Postsurgical changes with volume loss in the right upper lung. Underlying chronic interstitial markings/emphysematous changes. No pleural effusion or pneumothorax.  The heart is normal in size.  Degenerative changes of the visualized thoracolumbar spine. New complete compression fracture deformity of an upper/mid thoracic vertebral body, likely T6, new from prior CT.  IMPRESSION: Postsurgical changes in the right upper hemithorax.  No evidence of acute cardiopulmonary disease.  Near complete compression fracture deformity of an upper/mid thoracic vertebral body, likely T6, new from prior CT.   Electronically Signed    By: Julian Hy M.D.   On: 04/17/2014 11:27   Ct Angio Chest W/cm &/or Wo Cm  04/17/2014   CLINICAL DATA:  Acute onset of pressure in the mid chest radiating to the back earlier today. Prior right upper lobectomy in 1995 for lung cancer. Diagnosis of adenocarcinoma in the superior segment left lower lobe in January, 2014. Prior history of sarcoma of the jaw. Current history of hypertension and hypercholesterolemia. Current smoker.  EXAM: CT ANGIOGRAPHY CHEST WITH CONTRAST  TECHNIQUE: Multidetector CT imaging of the chest was performed using the standard protocol before and during bolus administration of intravenous contrast. Multiplanar CT image reconstructions and MIPs were obtained to evaluate the vascular anatomy.  CONTRAST:  179mL OMNIPAQUE IOHEXOL 350 MG/ML IV.  COMPARISON:  CTA chest 12/26/2013, 02/06/2013. CT chest 08/26/2012, 01/29/2012.  FINDINGS: Unenhanced images demonstrate no evidence of mural hematoma involving the thoracic or upper abdominal aorta. Moderate to severe atherosclerotic calcification of the aorta and the visualized proximal great vessels. Moderate atherosclerosis involving the left main, LAD and right coronary arteries.  Enhanced images demonstrate no evidence of aortic dissection or aneurysm. Moderate noncalcified plaque is present involving the visualized thoracic and upper abdominal aorta, without a large noncalcified plaque involving the left lateral wall of the distal thoracic aorta. Heart mildly enlarged. No pericardial effusion. Prominent epicardial fat. Central pulmonary arteries patent.  Right upper lobectomy. Post radiation pleuroparenchymal scarring and bronchiectasis in the remaining right middle lobe and superior segment right lower lobe, unchanged. Post radiation scarring and bronchiectasis medially in the left lung apex, unchanged. Likely post radiation pneumonitis/ fibrosis in the superior segment right lower lobe at the site of biopsy-proven adenocarcinoma, with  a residual 7 mm nodule, unchanged since the examination 3 months ago. Hyperlucency throughout the remaining right lung, unchanged. Emphysematous changes throughout the left lung, unchanged. New scattered ground-glass opacities throughout the right lower lobe. No new pulmonary parenchymal abnormalities elsewhere.  Interval increase in size of a soft tissue mass in the right hilum, current measurements approximating 2.4 x 2.6 cm (series 6, image 38), associated with narrowing of the right middle lobe and right lower lobe bronchi and several of their segmental branches. Stable approximate 2.4 x 1.2 cm upper right paratracheal lymph node (image 17). No new or enlarging lymphadenopathy elsewhere.  Diffuse steatosis throughout the visualized liver without focal hepatic parenchymal abnormality. Pancreatic atrophy again noted. Parapelvic cysts involving the visualized left kidney. Visualized upper abdomen otherwise unremarkable. Bone window images demonstrate severe generalized osseous demineralization with a new compression fracture of T7 which is on the order of 75% or so, without significant retropulsion.  Review of the MIP images confirms the above findings.  IMPRESSION: 1. No evidence of thoracic or upper abdominal aortic aneurysm or dissection. 2. Severe aortic atherosclerosis with a large noncalcified plaque  involving the left lateral wall of the distal thoracic aorta. This places the patient at risk for embolic disease. 3. Enlarging mass in the right hilum, measurements given above, either progressive lymphadenopathy or recurrent tumor centrally in the remaining right middle lobe lobe in this patient with prior right upper lobectomy. Stable mildly enlarged upper right paratracheal lymph node since the examination 3 months ago. No evidence of recurrent or metastatic disease elsewhere. 4. Post radiation fibrosis and bronchiectasis in the right middle lobe and superior segment right lower lobe. 5. Post radiation  pneumonitis/fibrosis at the site of biopsy-proven adenocarcinoma in the superior segment left lower lobe. Stable residual 7 mm nodule since the examination 3 months ago. 6. Diffuse steatosis involving the visualized liver without focal hepatic parenchymal abnormality. 7. New approximate 75% compression fracture of T7 since examination 3 months ago, likely osteoporotic.   Electronically Signed   By: Evangeline Dakin M.D.   On: 04/17/2014 13:16    Scheduled Meds: . ALPRAZolam  0.5 mg Oral TID  . aspirin  324 mg Oral NOW   Or  . aspirin  300 mg Rectal NOW  . aspirin EC  81 mg Oral Daily  . aspirin  325 mg Oral Daily  . atenolol  25 mg Oral q morning - 10a  . atorvastatin  10 mg Oral q1800  . levothyroxine  100 mcg Oral QAC breakfast  . mometasone-formoterol  2 puff Inhalation BID  . pantoprazole  40 mg Oral Daily  . tiotropium  18 mcg Inhalation Daily   Continuous Infusions: . heparin 1,100 Units/hr (04/17/14 1805)    Principal Problem:   Chest pain Active Problems:   CAD (coronary artery disease)   Dyslipidemia   COPD (chronic obstructive pulmonary disease) with emphysema   Lung nodule   Obstructive sleep apnea, adult   Malignant neoplasm of lower lobe, bronchus, or lung   Hx of radiation therapy   Smoker   Time spent: 35  This note has been created with Surveyor, quantity. Any transcriptional errors are unintentional.   Marzetta Board, MD [facility] Pager [phone]. If 7 PM - 7 AM, please contact night-coverage at [email or web address], password [email or web address] 04/18/2014, 7:44 AM  LOS: 1 day

## 2014-04-18 NOTE — Progress Notes (Signed)
Patient awoke form sleep complaining of shortness of breath, and left lower ribcage pain O2 sat 98% on 3L [place], RR 20, BP 138/56, pulse 86. PRN morphine given and PRN xanax given per patient request. Patient reports relief of pain and shortness of breath after giving morphine, will continue to monitor for effect of xanax.

## 2014-04-18 NOTE — Progress Notes (Signed)
Patient complains of nausea, denies chest pain, denies epigastric pain at this time. PRN zofran given. Alesia Richards, RN 04/18/2014 at [phone].

## 2014-04-18 NOTE — Progress Notes (Signed)
Patient states "I just don't feel right", continues to report nausea, continues to deny chest or epigastric pain. Patient is noted to have some difficulty trying to take a deep breath. EKG done as one was already ordered for this AM. See EKG results in chart. No abnormality noted on EKG. Vital signs at this time; T 98.9 BP 115/58, P93, RR 20, o2 sat 98%. After EKG completed, patient able to breathe normally, does continue to complain of nausea, continues to deny chest or epigastric pain, will continue to monitor. Alesia Richards, RN, 04/18/2014 at 0330.

## 2014-04-18 NOTE — Progress Notes (Signed)
ANTICOAGULATION CONSULT NOTE - Follow Up Consult  Pharmacy Consult for Lovenox Indication: VTE prophylaxis  Allergies  Allergen Reactions  . Fish Allergy Nausea And Vomiting  . Penicillins Hives and Itching    Patient Measurements: Height: 5\' 9"  (175.3 cm) Weight: 194 lb (87.998 kg) IBW/kg (Calculated) : 66.2  Vital Signs: Temp: 97.8 F (36.6 C) (08/26 0644) Temp src: Oral (08/26 0644) BP: 121/60 mmHg (08/26 0644) Pulse Rate: 91 (08/26 0644)  Labs:  Recent Labs  04/17/14 1107 04/17/14 1117 04/17/14 1724 04/18/14 0118  HGB 11.3* 12.9  --  10.8*  HCT 35.3* 38.0  --  34.1*  PLT 397  --   --  327  APTT  --   --  36  --   LABPROT  --   --  12.5  --   INR  --   --  0.93  --   HEPARINUNFRC  --   --   --  0.48  CREATININE 0.50 0.60 0.64 0.62  TROPONINI <0.30  --   --   --     Estimated Creatinine Clearance: 68.5 ml/min (by C-G formula based on Cr of 0.62).   Medications:  Scheduled:  . ALPRAZolam  0.5 mg Oral TID  . aspirin EC  81 mg Oral Daily  . atenolol  25 mg Oral q morning - 10a  . atorvastatin  10 mg Oral q1800  . levothyroxine  100 mcg Oral QAC breakfast  . mometasone-formoterol  2 puff Inhalation BID  . pantoprazole  40 mg Oral Daily  . tiotropium  18 mcg Inhalation Daily   Infusions:   PRN: acetaminophen, albuterol, ALPRAZolam, furosemide, morphine injection, nitroGLYCERIN, ondansetron (ZOFRAN) IV  Assessment: 35 yoM known to Pharmacy from previous IV heparin protocol.  Cardiac cause of chest pain ruled out, therefore, heparin dc'd and changing to lovenox for VTE prophylaxis.  Goal of Therapy:  Prevention of VTE Monitor platelets by anticoagulation protocol: Yes   Plan:   D/C IV heparin  Start lovenox 40mg  sq q24h  No further dosage adjustments needed, Pharmacy will sign-off  Peggyann Juba, PharmD, BCPS Pager: [phone] 04/18/2014,2:40 PM

## 2014-04-18 NOTE — Progress Notes (Signed)
ANTICOAGULATION CONSULT NOTE - Follow Up Consult  Pharmacy Consult for Heparin Indication: chest pain/ACS  Allergies  Allergen Reactions  . Fish Allergy Nausea And Vomiting  . Penicillins Hives and Itching    Patient Measurements: Height: 5\' 9"  (175.3 cm) Weight: 194 lb (87.998 kg) IBW/kg (Calculated) : 66.2 Heparin Dosing Weight:   Vital Signs: Temp: 97.6 F (36.4 C) (08/25 2154) Temp src: Oral (08/25 2154) BP: 118/62 mmHg (08/25 2154) Pulse Rate: 97 (08/25 2154)  Labs:  Recent Labs  04/17/14 1107 04/17/14 1117 04/17/14 1724 04/18/14 0118  HGB 11.3* 12.9  --  10.8*  HCT 35.3* 38.0  --  34.1*  PLT 397  --   --  327  APTT  --   --  36  --   LABPROT  --   --  12.5  --   INR  --   --  0.93  --   HEPARINUNFRC  --   --   --  0.48  CREATININE 0.50 0.60 0.64 0.62  TROPONINI <0.30  --   --   --     Estimated Creatinine Clearance: 68.5 ml/min (by C-G formula based on Cr of 0.62).   Medications:  Infusions:  . heparin 1,100 Units/hr (04/17/14 1805)    Assessment: Patient with heparin level at goal.  No issues noted.  Goal of Therapy:  Heparin level 0.3-0.7 units/ml Monitor platelets by anticoagulation protocol: Yes   Plan:  Continue heparin drip at current rate Recheck level at 0900  Tyler Deis, Shea Stakes [place]ford 04/18/2014,3:01 AM

## 2014-04-18 NOTE — Progress Notes (Signed)
Radiation Oncology         ([phone]) [phone] ________________________________  Name: Defne Gerling MRN: [id number]  Date: 04/18/2014  DOB: [birth year]  Reevaluation Note - inpatient  CC: [name], MD  Ivin Poot, MD  Diagnosis:   Clinical stage I non-small cell lung cancer presenting in the left lower lung  Interval Since Last Radiation:  One year and 5 months   Narrative:  The patient seen this evening out of the courtesy of Dr. Cruzita Lederer as in patient. The patient was originally scheduled to see me as an outpatient this week for followup of her compression fractures in the thoracic spine. she was recently admitted to the hospital with intense chest pain. She underwent extensive evaluation with no evidence of coronary issues causing her problems. Patient did undergo a chest CT scan which showed no evidence of pulmonary embolus but did show an enlarging mass along the right hilum measuring approximately 2.4 x 2.6 cm. Concern was high for recurrent lung cancer.  the area in the left lower lung was stable without any tumor growth. Patient does have a prior history of radiation therapy directed to the right chest with treatments at [facility] in 1995.  With this information the patient is now seen while she remains in the hospital. She denies any further chest pain this evening. She has had some mild hemoptysis.         Patient did inform me that she has been found to have a mass within the left breast by her primary care physician and ultrasound and mammography has been ordered to evaluate this issue.                    ALLERGIES:  is allergic to fish allergy and penicillins.  Meds: No current facility-administered medications for this encounter.   No current outpatient prescriptions on file.   Facility-Administered Medications Ordered in Other Encounters  Medication Dose Route Frequency Provider Last Rate Last Dose  . acetaminophen (TYLENOL) tablet 650 mg  650 mg Oral Q4H PRN  Nita Sells, MD   650 mg at 04/18/14 0808  . albuterol (PROVENTIL) (2.5 MG/3ML) 0.083% nebulizer solution 3 mL  3 mL Inhalation Q4H PRN Nita Sells, MD   3 mL at 04/18/14 1543  . ALPRAZolam Duanne Moron) tablet 0.25 mg  0.25 mg Oral BID PRN Nita Sells, MD   0.25 mg at 04/18/14 0808  . ALPRAZolam Duanne Moron) tablet 0.5 mg  0.5 mg Oral TID Nita Sells, MD   0.5 mg at 04/18/14 1528  . aspirin EC tablet 81 mg  81 mg Oral Daily Nita Sells, MD   81 mg at 04/18/14 1005  . atenolol (TENORMIN) tablet 25 mg  25 mg Oral q morning - 10a Nita Sells, MD   25 mg at 04/18/14 1002  . atorvastatin (LIPITOR) tablet 10 mg  10 mg Oral q1800 Nita Sells, MD   10 mg at 04/18/14 1718  . enoxaparin (LOVENOX) injection 40 mg  40 mg Subcutaneous Q24H Emiliano Dyer, RPH   40 mg at 04/18/14 1718  . furosemide (LASIX) tablet 20 mg  20 mg Oral Daily PRN Nita Sells, MD      . levothyroxine (SYNTHROID, LEVOTHROID) tablet 100 mcg  100 mcg Oral QAC breakfast Nita Sells, MD   100 mcg at 04/18/14 0808  . mometasone-formoterol (DULERA) 100-5 MCG/ACT inhaler 2 puff  2 puff Inhalation BID Nita Sells, MD   2 puff at 04/18/14 1951  . morphine 2  MG/ML injection 1 mg  1 mg Intravenous Q2H PRN Nita Sells, MD      . nitroGLYCERIN (NITROSTAT) SL tablet 0.4 mg  0.4 mg Sublingual Q5 Min x 3 PRN Nita Sells, MD      . ondansetron (ZOFRAN) injection 4 mg  4 mg Intravenous Q6H PRN Nita Sells, MD   4 mg at 04/18/14 0303  . pantoprazole (PROTONIX) EC tablet 40 mg  40 mg Oral Daily Nita Sells, MD   40 mg at 04/18/14 1002  . tiotropium (SPIRIVA) inhalation capsule 18 mcg  18 mcg Inhalation Daily Nita Sells, MD   18 mcg at 04/18/14 0945    Physical Findings: The patient is in no acute distress. Patient is alert and oriented. She is lying in her hospital bed and quite conversant and pleasant. She has several family members present  this evening. Patient has 3 L of oxygen in place area.       Lab Findings: Lab Results  Component Value Date   WBC 8.2 04/18/2014   HGB 10.8* 04/18/2014   HCT 34.1* 04/18/2014   MCV 93.7 04/18/2014   PLT 327 04/18/2014      Radiographic Findings: Dg Chest 2 View  04/17/2014   CLINICAL DATA:  Shortness of breath, productive cough, history of right lung cancer  EXAM: CHEST  2 VIEW  COMPARISON:  Chest radiograph dated 01/29/2014. CT chest dated 12/26/2013.  FINDINGS: Postsurgical changes with volume loss in the right upper lung. Underlying chronic interstitial markings/emphysematous changes. No pleural effusion or pneumothorax.  The heart is normal in size.  Degenerative changes of the visualized thoracolumbar spine. New complete compression fracture deformity of an upper/mid thoracic vertebral body, likely T6, new from prior CT.  IMPRESSION: Postsurgical changes in the right upper hemithorax.  No evidence of acute cardiopulmonary disease.  Near complete compression fracture deformity of an upper/mid thoracic vertebral body, likely T6, new from prior CT.   Electronically Signed   By: Julian Hy M.D.   On: 04/17/2014 11:27   Ct Angio Chest W/cm &/or Wo Cm  04/17/2014   CLINICAL DATA:  Acute onset of pressure in the mid chest radiating to the back earlier today. Prior right upper lobectomy in 1995 for lung cancer. Diagnosis of adenocarcinoma in the superior segment left lower lobe in January, 2014. Prior history of sarcoma of the jaw. Current history of hypertension and hypercholesterolemia. Current smoker.  EXAM: CT ANGIOGRAPHY CHEST WITH CONTRAST  TECHNIQUE: Multidetector CT imaging of the chest was performed using the standard protocol before and during bolus administration of intravenous contrast. Multiplanar CT image reconstructions and MIPs were obtained to evaluate the vascular anatomy.  CONTRAST:  118mL OMNIPAQUE IOHEXOL 350 MG/ML IV.  COMPARISON:  CTA chest 12/26/2013, 02/06/2013. CT chest  08/26/2012, 01/29/2012.  FINDINGS: Unenhanced images demonstrate no evidence of mural hematoma involving the thoracic or upper abdominal aorta. Moderate to severe atherosclerotic calcification of the aorta and the visualized proximal great vessels. Moderate atherosclerosis involving the left main, LAD and right coronary arteries.  Enhanced images demonstrate no evidence of aortic dissection or aneurysm. Moderate noncalcified plaque is present involving the visualized thoracic and upper abdominal aorta, without a large noncalcified plaque involving the left lateral wall of the distal thoracic aorta. Heart mildly enlarged. No pericardial effusion. Prominent epicardial fat. Central pulmonary arteries patent.  Right upper lobectomy. Post radiation pleuroparenchymal scarring and bronchiectasis in the remaining right middle lobe and superior segment right lower lobe, unchanged. Post radiation scarring and bronchiectasis medially in the  left lung apex, unchanged. Likely post radiation pneumonitis/ fibrosis in the superior segment right lower lobe at the site of biopsy-proven adenocarcinoma, with a residual 7 mm nodule, unchanged since the examination 3 months ago. Hyperlucency throughout the remaining right lung, unchanged. Emphysematous changes throughout the left lung, unchanged. New scattered ground-glass opacities throughout the right lower lobe. No new pulmonary parenchymal abnormalities elsewhere.  Interval increase in size of a soft tissue mass in the right hilum, current measurements approximating 2.4 x 2.6 cm (series 6, image 38), associated with narrowing of the right middle lobe and right lower lobe bronchi and several of their segmental branches. Stable approximate 2.4 x 1.2 cm upper right paratracheal lymph node (image 17). No new or enlarging lymphadenopathy elsewhere.  Diffuse steatosis throughout the visualized liver without focal hepatic parenchymal abnormality. Pancreatic atrophy again noted. Parapelvic  cysts involving the visualized left kidney. Visualized upper abdomen otherwise unremarkable. Bone window images demonstrate severe generalized osseous demineralization with a new compression fracture of T7 which is on the order of 75% or so, without significant retropulsion.  Review of the MIP images confirms the above findings.  IMPRESSION: 1. No evidence of thoracic or upper abdominal aortic aneurysm or dissection. 2. Severe aortic atherosclerosis with a large noncalcified plaque involving the left lateral wall of the distal thoracic aorta. This places the patient at risk for embolic disease. 3. Enlarging mass in the right hilum, measurements given above, either progressive lymphadenopathy or recurrent tumor centrally in the remaining right middle lobe lobe in this patient with prior right upper lobectomy. Stable mildly enlarged upper right paratracheal lymph node since the examination 3 months ago. No evidence of recurrent or metastatic disease elsewhere. 4. Post radiation fibrosis and bronchiectasis in the right middle lobe and superior segment right lower lobe. 5. Post radiation pneumonitis/fibrosis at the site of biopsy-proven adenocarcinoma in the superior segment left lower lobe. Stable residual 7 mm nodule since the examination 3 months ago. 6. Diffuse steatosis involving the visualized liver without focal hepatic parenchymal abnormality. 7. New approximate 75% compression fracture of T7 since examination 3 months ago, likely osteoporotic.   Electronically Signed   By: Evangeline Dakin M.D.   On: 04/17/2014 13:16    Impression:  Probable recurrent right lung cancer. As above the patient has had prior radiation therapy to the right lung at [facility] totaling 27 treatments. She is also had prior radiation therapy for her left lower lung nodule treated approximately one year and 5 months ago. I reviewed the chest CT scan findings with the patient and her family. We discussed the potential workup of  this area with the understanding that she likely not be a candidate for aggressive treatment concerning this area. She is not a candidate for surgery and would not be able to obtain a significant dose of radiation therapy without causing significant scar tissue of her remaining normal lung and potential worsening of her breathing.  Plan:  The patient is undecided whether she would like workup of this new finding on chest CT scan, but once discharged from the hospital she will let me know her plans concerning this issue.  ____________________________________ Blair Promise, MD

## 2014-04-19 LAB — CBC
HEMATOCRIT: 32.3 % — AB (ref 36.0–46.0)
HEMOGLOBIN: 10.3 g/dL — AB (ref 12.0–15.0)
MCH: 29.5 pg (ref 26.0–34.0)
MCHC: 31.9 g/dL (ref 30.0–36.0)
MCV: 92.6 fL (ref 78.0–100.0)
PLATELETS: 330 10*3/uL (ref 150–400)
RBC: 3.49 MIL/uL — AB (ref 3.87–5.11)
RDW: 13.7 % (ref 11.5–15.5)
WBC: 6.4 10*3/uL (ref 4.0–10.5)

## 2014-04-19 NOTE — Progress Notes (Signed)
Pt selected [facility] for HHPT, information called and faxed to [facility] [phone], office [phone].

## 2014-04-19 NOTE — Progress Notes (Signed)
Patient complained of shortness of breath and left sided rib cage pain after episode of coughing. O2 sats 96% on 3L , PRN morphine given, respiratory therapy to floor to administer albulterol nebulizer. Patient reports improving shortness of breath and relief of pain. Alesia Richards, RN, 04/19/2014, at [phone].

## 2014-04-19 NOTE — Discharge Instructions (Signed)
You were cared for by a hospitalist during your hospital stay. If you have any questions about your discharge medications or the care you received while you were in the hospital after you are discharged, you can call the unit and asked to speak with the hospitalist on call if the hospitalist that took care of you is not available. Once you are discharged, your primary care physician will handle any further medical issues. Please note that NO REFILLS for any discharge medications will be authorized once you are discharged, as it is imperative that you return to your primary care physician (or establish a relationship with a primary care physician if you do not have one) for your aftercare needs so that they can reassess your need for medications and monitor your lab values.     If you do not have a primary care physician, you can call [phone] for a physician referral.  Follow with Primary MD [name], MD in 2 weeks    Get CBC, CMP checked by your doctor and again as further instructed.  Get a 2 view Chest X ray done next visit if you had Pneumonia of Lung problems at the [place] [facility] reviewed and adjusted.  Please request your Prim.MD to go over all Hospital Tests and Procedure/Radiological results at the follow up, please get all Hospital records sent to your Prim MD by signing hospital release before you go home.  Activity: As tolerated with Full fall precautions use walker/cane & assistance as needed  Diet: regular  For Heart failure patients - Check your Weight same time everyday, if you gain over 2 pounds, or you develop in leg swelling, experience more shortness of breath or chest pain, call your Primary MD immediately. Follow Cardiac Low Salt Diet and 1.8 lit/day fluid restriction.  Disposition Home  If you experience worsening of your admission symptoms, develop shortness of breath, life threatening emergency, suicidal or homicidal thoughts you must seek medical  attention immediately by calling 911 or calling your MD immediately  if symptoms less severe.  You Must read complete instructions/literature along with all the possible adverse reactions/side effects for all the Medicines you take and that have been prescribed to you. Take any new Medicines after you have completely understood and accpet all the possible adverse reactions/side effects.   Do not drive and provide baby sitting services if your were admitted for syncope or siezures until you have seen by Primary MD or a Neurologist and advised to do so again.  Do not drive when taking Pain medications.   Do not take more than prescribed Pain, Sleep and Anxiety Medications  Special Instructions: If you have smoked or chewed Tobacco  in the last 2 yrs please stop smoking, stop any regular Alcohol  and or any Recreational drug use.  Wear Seat belts while driving.

## 2014-04-19 NOTE — Evaluation (Signed)
Physical Therapy One Time Evaluation Patient Details Name: Alexandria Warren MRN: [id number] DOB: 09-12-1933 Today's Date: 04/19/2014   History of Present Illness  Pt is a 80 year old female admitted with chest pressure.  No further inpatient cardiac workup per cardiology.  CT chest showed mass in right hilum and per oncology probable recurrent right lung cancer.  Clinical Impression  Patient evaluated by Physical Therapy with no further acute PT needs identified. All education has been completed and the patient has no further questions.  Pt anticipates d/c home today and states she feels comfortable returning home alone with home health services.  Pt reports she has had HHPT in the past and very agreeable to HHPT upon d/c.  All further needs can be addressed by HHPT. PT is signing off. Thank you for this referral.     Follow Up Recommendations Home health PT    Equipment Recommendations  None recommended by PT    Recommendations for Other Services       Precautions / Restrictions Precautions Precaution Comments: chronic 3L O2      Mobility  Bed Mobility Overal bed mobility: Needs Assistance             General bed mobility comments: sitting EOB on arrival  Transfers Overall transfer level: Needs assistance Equipment used: Rolling walker (2 wheeled) Transfers: Sit to/from Stand Sit to Stand: Min assist         General transfer comment: performed without RW and pt with LOB min assist to correct and return to sitting, performed again with RW and pt more steady  Ambulation/Gait Ambulation/Gait assistance: Min guard Ambulation Distance (Feet): 100 Feet Assistive device: Rolling walker (2 wheeled) Gait Pattern/deviations: Step-through pattern;Trunk flexed     General Gait Details: verbal cues for RW positioning, pt fatigues quickly  Stairs            Wheelchair Mobility    Modified Rankin (Stroke Patients Only)       Balance                                              Pertinent Vitals/Pain      Home Living Family/patient expects to be discharged to:: Private residence Living Arrangements: Alone Available Help at Discharge: Family Type of Home: House Home Access: Stairs to enter   Technical brewer of Steps: 3-4 Home Layout: One level Home Equipment: Environmental consultant - 2 wheels;Cane - single point;Toilet riser      Prior Function Level of Independence: Independent with assistive device(s)         Comments: reports independent with ADLs just takes increased time due to SOB/fatigue     Hand Dominance        Extremity/Trunk Assessment               Lower Extremity Assessment: Generalized weakness         Communication   Communication: No difficulties  Cognition Arousal/Alertness: Awake/alert Behavior During Therapy: WFL for tasks assessed/performed Overall Cognitive Status: Within Functional Limits for tasks assessed                      General Comments      Exercises        Assessment/Plan    PT Assessment All further PT needs can be met in the next venue of care  PT Diagnosis Generalized  weakness   PT Problem List Decreased strength;Decreased activity tolerance;Decreased mobility;Decreased balance  PT Treatment Interventions     PT Goals (Current goals can be found in the Care Plan section) Acute Rehab PT Goals PT Goal Formulation: No goals set, d/c therapy    Frequency     Barriers to discharge        Co-evaluation               End of Session Equipment Utilized During Treatment: Oxygen Activity Tolerance: Patient limited by fatigue Patient left: in bed;with call bell/phone within reach;with family/visitor present      Functional Assessment Tool Used: clinical judgement Functional Limitation: Mobility: Walking and moving around Mobility: Walking and Moving Around Current Status (O3500): At least 20 percent but less than 40 percent impaired, limited or  restricted Mobility: Walking and Moving Around Goal Status [phone]): At least 1 percent but less than 20 percent impaired, limited or restricted Mobility: Walking and Moving Around Discharge Status [phone]): At least 1 percent but less than 20 percent impaired, limited or restricted    Time: 0848-0904 PT Time Calculation (min): 16 min   Charges:   PT Evaluation $Initial PT Evaluation Tier I: 1 Procedure PT Treatments $Gait Training: 8-22 mins   PT G Codes:   Functional Assessment Tool Used: clinical judgement Functional Limitation: Mobility: Walking and moving around    Gilman E 04/19/2014, 10:38 AM Carmelia Bake, PT, DPT 04/19/2014 Pager: [phone]

## 2014-04-19 NOTE — Discharge Summary (Signed)
Physician Discharge Summary  Alexandria Warren BMS:[id number] DOB: [birth year] DOA: 04/17/2014  PCP: [name], MD  Admit date: 04/17/2014 Discharge date: 04/19/2014  Time spent: 35 minutes  Recommendations for Outpatient Follow-up:  1. Follow up with PCP in 1-2 weeks 2. Follow up with Dr. Sondra Come in 1-2 weeks   Recommendations for primary care physician for things to follow:  Re-assess tobacco cessation  Discharge Diagnoses:  Principal Problem:   Chest pain Active Problems:   CAD (coronary artery disease)   Dyslipidemia   COPD (chronic obstructive pulmonary disease) with emphysema   Lung nodule   Obstructive sleep apnea, adult   Malignant neoplasm of lower lobe, bronchus, or lung   Hx of radiation therapy   Smoker  Discharge Condition: stable  Diet recommendation: heart healthy  Filed Weights   04/17/14 1639  Weight: 87.998 kg (194 lb)    History of present illness:  80 y/o F, known stage I NSLC s/p SBRT LLL nodule, hx XRT COPD on 3 L of oxygen at home, still smoker, multiple compression fractures in thoracic spine as recent as 12/2013 (T6) , 01/2014 (T 13)--MRI performed showed no lumbar disease however did not comment on thoracic disease?? 5/15, history CAD 2011 = 78% LAD, 30% RCA stenosis, hypertension, obstructive sleep apne presented to emergency room with chest pain she states that this started maybe about 2 days ago. She was asleep at night and felt pressure in the center of her chest with no associated radiation or weakness. It was not relieved by anything specific. She then had her birthday party yesterday and subsequently her breath cut short she felt hot and diaphoretic and had recurrence of discomfort in her chest. Blood pressure was elevated and she hadn't taken her medications so she took a Xanax and some other blood pressure pill blood pressure got better and she went to bed but woke up around 4 AM with pain in her back between her shoulder blades. This is in the  vicinity of a prior T7 pathological fracture .  Emergency room workup showed normal labs Including normal point-of-care troponin CT chest showed no aneurysm or dissection, severe aortic atherosclerosis, enlarging mass in the right hilum DDX tumor vs. lymphadenopathy New 78% pathological fracture T7 postradiation pneumonitis  Hospital Course:  Chest pressure - patient admitted to telemetry floor and empirically started on ACS protocol with heparin. Cardiology was consulted and followed patient while hospitalized. Her chest pain does not seem cardiac and her troponin has remained negative. This may have been related to her underlying malignancy.  CAD (coronary artery disease) - cardiology evaluated patient while hospitalized.  Dyslipidemia - continue statin  T7 compression fracture - minimal pain, conservative management  COPD (chronic obstructive pulmonary disease) with emphysema - stable during this hospitalization, to resume previous medications. She continues to smoke.  Obstructive sleep apnea, adult - she will need to continue her BiPAP  Malignant neoplasm of lower lobe, bronchus, or lung-unfortunately the hilar mass maybe considered a recurrence of lung cancer. Dr. Sondra Come saw patient while hospitalized and will follow up shortly as an outpatient. Patient herself is not sure at this point how much she would want to have done and will think about her options.   Hx of radiation therapy  Smoker - she continues to smoke and has cut back to one pack day.   Procedures:  None    Consultations:  Cardiology  Radiation Oncology   Discharge Exam: Filed Vitals:   04/18/14 2343 04/19/14 2233 04/19/14 6122 04/19/14  0818  BP: 138/56 134/68    Pulse: 86 97    Temp:  97.6 F (36.4 C)    TempSrc:  Oral    Resp: 22 18    Height:      Weight:      SpO2: 98% 95% 95% 95%    General: NAD Cardiovascular: RRR Respiratory: overall decreased breath sounds  Discharge Instructions     Medication  List         acetaminophen 500 MG tablet  Commonly known as:  TYLENOL  Take 1,000 mg by mouth every 6 (six) hours as needed.     albuterol 108 (90 BASE) MCG/ACT inhaler  Commonly known as:  PROVENTIL HFA;VENTOLIN HFA  Inhale 1 puff into the lungs every 4 (four) hours as needed for wheezing or shortness of breath.     alclomethasone 0.05 % cream  Commonly known as:  ACLOVATE  Apply 1 application topically 2 (two) times daily. Under breast     ALPRAZolam 0.5 MG tablet  Commonly known as:  XANAX  Take 0.5 mg by mouth 3 (three) times daily.     aspirin 81 MG tablet  Take 81 mg by mouth every morning.     atenolol 25 MG tablet  Commonly known as:  TENORMIN  Take 25 mg by mouth every morning.     Co Q 10 10 MG Caps  Take 1 capsule by mouth every morning.     docusate sodium 100 MG capsule  Commonly known as:  COLACE  Take 100 mg by mouth once as needed.     econazole nitrate 1 % cream  Apply 1 application topically 2 (two) times daily. Under breast     furosemide 20 MG tablet  Commonly known as:  LASIX  Take 20 mg by mouth daily as needed for fluid or edema (monday tuesday wednesday and thursday).     levothyroxine 100 MCG tablet  Commonly known as:  SYNTHROID, LEVOTHROID  Take 100 mcg by mouth daily before breakfast.     lisinopril 10 MG tablet  Commonly known as:  PRINIVIL,ZESTRIL  Take 10 mg by mouth 2 (two) times daily.     mometasone-formoterol 100-5 MCG/ACT Aero  Commonly known as:  DULERA  Inhale 2 puffs into the lungs 2 (two) times daily.     omeprazole 40 MG capsule  Commonly known as:  PRILOSEC  Take 40 mg by mouth every morning.     OVER THE COUNTER MEDICATION  Take 1 capsule by mouth 2 (two) times daily. Super Strength EGCG  350 mg.     rosuvastatin 40 MG tablet  Commonly known as:  CRESTOR  Take 40 mg by mouth every evening.     tiotropium 18 MCG inhalation capsule  Commonly known as:  SPIRIVA  Place 1 capsule (18 mcg total) into inhaler and  inhale daily.           Follow-up Information   Follow up with [name], MD. Schedule an appointment as soon as possible for a visit in 2 weeks.   Specialty:  Family Medicine   Contact information:   [address] [place] [address] [phone]       Follow up with Blair Promise, MD. Schedule an appointment as soon as possible for a visit in 2 weeks.   Specialty:  Radiation Oncology   Contact information:   [address] [place] [address] [phone]       The results of significant  diagnostics from this hospitalization (including imaging, microbiology, ancillary and laboratory) are listed below for reference.    Significant Diagnostic Studies: Dg Chest 2 View  04/17/2014   CLINICAL DATA:  Shortness of breath, productive cough, history of right lung cancer  EXAM: CHEST  2 VIEW  COMPARISON:  Chest radiograph dated 01/29/2014. CT chest dated 12/26/2013.  FINDINGS: Postsurgical changes with volume loss in the right upper lung. Underlying chronic interstitial markings/emphysematous changes. No pleural effusion or pneumothorax.  The heart is normal in size.  Degenerative changes of the visualized thoracolumbar spine. New complete compression fracture deformity of an upper/mid thoracic vertebral body, likely T6, new from prior CT.  IMPRESSION: Postsurgical changes in the right upper hemithorax.  No evidence of acute cardiopulmonary disease.  Near complete compression fracture deformity of an upper/mid thoracic vertebral body, likely T6, new from prior CT.   Electronically Signed   By: Julian Hy M.D.   On: 04/17/2014 11:27   Ct Angio Chest W/cm &/or Wo Cm  04/17/2014   CLINICAL DATA:  Acute onset of pressure in the mid chest radiating to the back earlier today. Prior right upper lobectomy in 1995 for lung cancer. Diagnosis of adenocarcinoma in the superior segment left lower lobe in January, 2014. Prior history of sarcoma of the jaw. Current history of  hypertension and hypercholesterolemia. Current smoker.  EXAM: CT ANGIOGRAPHY CHEST WITH CONTRAST  TECHNIQUE: Multidetector CT imaging of the chest was performed using the standard protocol before and during bolus administration of intravenous contrast. Multiplanar CT image reconstructions and MIPs were obtained to evaluate the vascular anatomy.  CONTRAST:  152mL OMNIPAQUE IOHEXOL 350 MG/ML IV.  COMPARISON:  CTA chest 12/26/2013, 02/06/2013. CT chest 08/26/2012, 01/29/2012.  FINDINGS: Unenhanced images demonstrate no evidence of mural hematoma involving the thoracic or upper abdominal aorta. Moderate to severe atherosclerotic calcification of the aorta and the visualized proximal great vessels. Moderate atherosclerosis involving the left main, LAD and right coronary arteries.  Enhanced images demonstrate no evidence of aortic dissection or aneurysm. Moderate noncalcified plaque is present involving the visualized thoracic and upper abdominal aorta, without a large noncalcified plaque involving the left lateral wall of the distal thoracic aorta. Heart mildly enlarged. No pericardial effusion. Prominent epicardial fat. Central pulmonary arteries patent.  Right upper lobectomy. Post radiation pleuroparenchymal scarring and bronchiectasis in the remaining right middle lobe and superior segment right lower lobe, unchanged. Post radiation scarring and bronchiectasis medially in the left lung apex, unchanged. Likely post radiation pneumonitis/ fibrosis in the superior segment right lower lobe at the site of biopsy-proven adenocarcinoma, with a residual 7 mm nodule, unchanged since the examination 3 months ago. Hyperlucency throughout the remaining right lung, unchanged. Emphysematous changes throughout the left lung, unchanged. New scattered ground-glass opacities throughout the right lower lobe. No new pulmonary parenchymal abnormalities elsewhere.  Interval increase in size of a soft tissue mass in the right hilum,  current measurements approximating 2.4 x 2.6 cm (series 6, image 38), associated with narrowing of the right middle lobe and right lower lobe bronchi and several of their segmental branches. Stable approximate 2.4 x 1.2 cm upper right paratracheal lymph node (image 17). No new or enlarging lymphadenopathy elsewhere.  Diffuse steatosis throughout the visualized liver without focal hepatic parenchymal abnormality. Pancreatic atrophy again noted. Parapelvic cysts involving the visualized left kidney. Visualized upper abdomen otherwise unremarkable. Bone window images demonstrate severe generalized osseous demineralization with a new compression fracture of T7 which is on the order of 75% or so, without significant  retropulsion.  Review of the MIP images confirms the above findings.  IMPRESSION: 1. No evidence of thoracic or upper abdominal aortic aneurysm or dissection. 2. Severe aortic atherosclerosis with a large noncalcified plaque involving the left lateral wall of the distal thoracic aorta. This places the patient at risk for embolic disease. 3. Enlarging mass in the right hilum, measurements given above, either progressive lymphadenopathy or recurrent tumor centrally in the remaining right middle lobe lobe in this patient with prior right upper lobectomy. Stable mildly enlarged upper right paratracheal lymph node since the examination 3 months ago. No evidence of recurrent or metastatic disease elsewhere. 4. Post radiation fibrosis and bronchiectasis in the right middle lobe and superior segment right lower lobe. 5. Post radiation pneumonitis/fibrosis at the site of biopsy-proven adenocarcinoma in the superior segment left lower lobe. Stable residual 7 mm nodule since the examination 3 months ago. 6. Diffuse steatosis involving the visualized liver without focal hepatic parenchymal abnormality. 7. New approximate 75% compression fracture of T7 since examination 3 months ago, likely osteoporotic.   Electronically  Signed   By: Evangeline Dakin M.D.   On: 04/17/2014 13:16   Labs: Basic Metabolic Panel:  Recent Labs Lab 04/17/14 1107 04/17/14 1117 04/17/14 1724 04/18/14 0118  NA 138 138 140 137  K 4.4 4.3 4.2 3.8  CL 97 95* 98 94*  CO2 32  --  29 31  GLUCOSE 161* 154* 121* 181*  BUN 11 11 13 15   CREATININE 0.50 0.60 0.64 0.62  CALCIUM 9.9  --  9.7 9.2   Liver Function Tests:  Recent Labs Lab 04/17/14 1107 04/17/14 1724  AST 12 11  ALT 9 8  ALKPHOS 71 67  BILITOT 0.4 0.3  PROT 7.7 7.2  ALBUMIN 3.7 3.5   CBC:  Recent Labs Lab 04/17/14 1107 04/17/14 1117 04/18/14 0118 04/19/14 0410  WBC 8.6  --  8.2 6.4  NEUTROABS 5.5  --   --   --   HGB 11.3* 12.9 10.8* 10.3*  HCT 35.3* 38.0 34.1* 32.3*  MCV 91.5  --  93.7 92.6  PLT 397  --  327 330   Cardiac Enzymes:  Recent Labs Lab 04/17/14 1107  TROPONINI <0.30   BNP: BNP (last 3 results)  Recent Labs  01/29/14 1513 04/17/14 1521 04/17/14 1723  PROBNP 81.4 105.1 5.5   Signed:  Marzetta Board  [facility] 04/19/2014, 1:10 PM

## 2014-04-23 NOTE — Addendum Note (Signed)
Encounter addended by: Jacqulyn Liner, RN on: 04/23/2014 10:01 AM<BR>     Documentation filed: Charges VN

## 2014-05-01 ENCOUNTER — Telehealth: Payer: Self-pay | Admitting: Pulmonary Disease

## 2014-05-01 NOTE — Telephone Encounter (Signed)
Called spoke with Lavella Lemons.  She reports pt had CT scan-cancer in R lung had come back. Per pt it is size of palm and nothing can be done. She tried calling Dr. Gery Pray the doc whom told her this. She saw pt today and reports she had been feeling more SOB and turned O2 up to 4 l/m.  She advised pt by doing this it could cause more harm than good w/o consulting [place] first.  I called pt and line rang busy x 3 wcb

## 2014-05-02 ENCOUNTER — Telehealth: Payer: Self-pay | Admitting: Oncology

## 2014-05-02 NOTE — Telephone Encounter (Signed)
Received a message from [employer] caregiver, Tonya.  She is wondering what Jaimee's prognosis is and what the treatment plan is for her.  Called her back and let her know that I would forward her request to Dr. Bobbe Medico.  She also said that she had a call in to the pulmonologist yesterday because Rhys has increased her oxygen to 4 liters.  She is wondering if this is OK to do.  Advised her that it would be up to the pulmonologist and to call them again today.  Tonya verbalized agreement.

## 2014-05-03 ENCOUNTER — Telehealth: Payer: Self-pay | Admitting: *Deleted

## 2014-05-03 ENCOUNTER — Other Ambulatory Visit: Payer: Self-pay | Admitting: Radiation Oncology

## 2014-05-03 DIAGNOSIS — C3430 Malignant neoplasm of lower lobe, unspecified bronchus or lung: Secondary | ICD-10-CM

## 2014-05-03 NOTE — Telephone Encounter (Signed)
Called and spoke to pt. Pt stated she had increased her O2 to 3.5lpm when she normally wears 3lpm 24/7. Pt stated she increased her O2 d/t SOB. Pt stated she is no longer experiencing SOB since increasing the liter flow. Pt dx with lung cancer and is seeing oncologist, pt was d/c from [facility] on 04/20/14. Pt has f/u on 05/25/14 with [facility]. Pt refused sooner f/u.   Will forward to [facility] for FYI.

## 2014-05-03 NOTE — Telephone Encounter (Signed)
CALLED PATIENT TO INFORM OF PET SCAN FOR 05-05-14 @ 8 AM @ [facility], SPOKE WITH PATIENT AND SHE IS AWARE OF THIS TEST.

## 2014-05-04 NOTE — Telephone Encounter (Signed)
Ok to leave on 4 liters if her sats are less than 90% on 3 liters.

## 2014-05-04 NOTE — Telephone Encounter (Signed)
Santiago Glad,  Please call the patient's caregiver back and tell her that we will know more about her prognosis after her PET scan on Monday. Thanks Rulon Sera.D.

## 2014-05-04 NOTE — Telephone Encounter (Signed)
[email or web address].Jennifer Castillo, CMA  

## 2014-05-04 NOTE — Telephone Encounter (Signed)
Pt does have pulse ox at home.  She states sats were running 86% on 3L.  Sats are now 92-96 % since increasing oxygen to 3.5 - 4 L.  Please advise if any changes.

## 2014-05-04 NOTE — Telephone Encounter (Signed)
See if pt has oximeter at home that she can check her sats.  If 90% or greater, she does not need more oxygen.  Let me know if she does not have oximeter.

## 2014-05-05 ENCOUNTER — Encounter (HOSPITAL_COMMUNITY): Payer: Medicare HMO

## 2014-05-07 NOTE — Telephone Encounter (Signed)
Called and spoke with tanya with [employer] and she stated that the pt does not have a pulse ox at home to measure her oxygen levels to see if she needs to turn the oxygen up.  [place] please advise. thanks

## 2014-05-07 NOTE — Telephone Encounter (Signed)
Makes no sense guys.  Look down at denise's note that says she does and has actual values.

## 2014-05-07 NOTE — Telephone Encounter (Signed)
LMTCB with patient-need to make sure patient indeed has pulse ox at home she uses or was she referring to when the nurse checks her O2 at home when she spoke with [place]. If she states she has one at home she uses as needed through out the day then we need to make sure Alexandria Warren at [facility] understands this.

## 2014-05-08 NOTE — Telephone Encounter (Signed)
LMTCB for patient.

## 2014-05-09 NOTE — Telephone Encounter (Signed)
LMTCB

## 2014-05-10 ENCOUNTER — Telehealth: Payer: Self-pay | Admitting: *Deleted

## 2014-05-10 NOTE — Telephone Encounter (Signed)
Spoke with patient-she states she does have pulse ox at home; she ordered it own her on and paid for out of pocket. I called Tanya at [facility] and had to leave a message. We need to make sure she understands patient has pulse ox and uses regularly. Nothing else should be needed once Lavella Lemons has been made are of this.

## 2014-05-10 NOTE — Telephone Encounter (Signed)
CALLED PATIENT TO INFORM THAT PET SCAN HAS BEEN RESCHEDULED FOR 05-17-14, PATIENT DECLINED THIS TEST BECAUSE SHE CANNOT DUE WITHOUT HER OXYGEN FOR 45 MINS., INFORMED DR. Chili

## 2014-05-11 NOTE — Telephone Encounter (Signed)
Called and spoke with tanya from [facility] and she is aware of pt having the pulse ox at home.  Tanya asked that if we speak to the pt again please advise the pt to not smoke in the house with the oxygen.

## 2014-05-17 ENCOUNTER — Encounter (HOSPITAL_COMMUNITY): Payer: Medicare HMO

## 2014-05-25 ENCOUNTER — Ambulatory Visit: Payer: Medicare Other | Admitting: Pulmonary Disease

## 2014-06-28 IMAGING — CR DG CHEST 2V
2 series · 2 of 2 positions shown · non-contrast
Comparison: 01/11/2013.

CLINICAL DATA: Cough and shortness of breath today.  History of
lung cancer.  Smoker.

CHEST - 2 VIEW

[w chest lat]
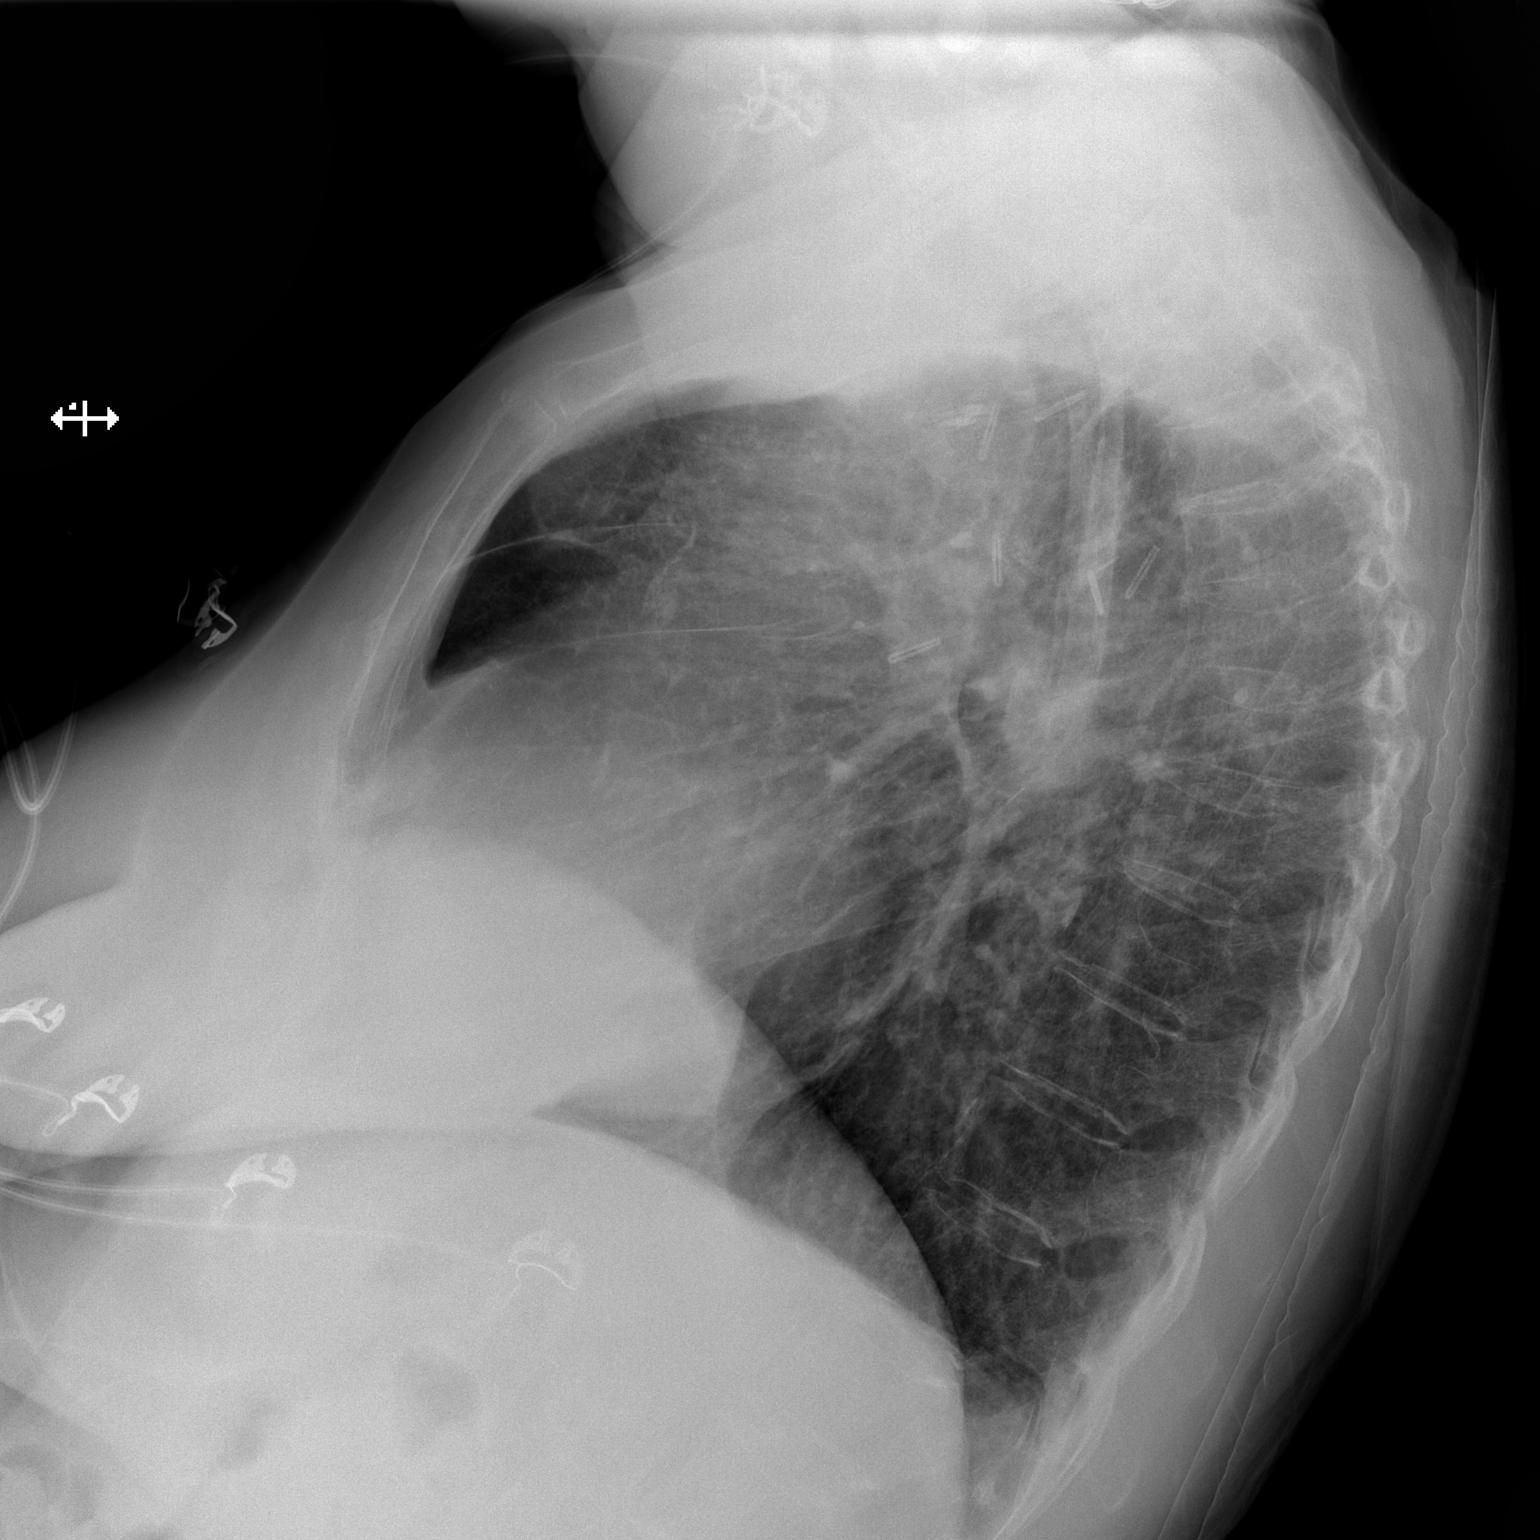

[x chest ap]
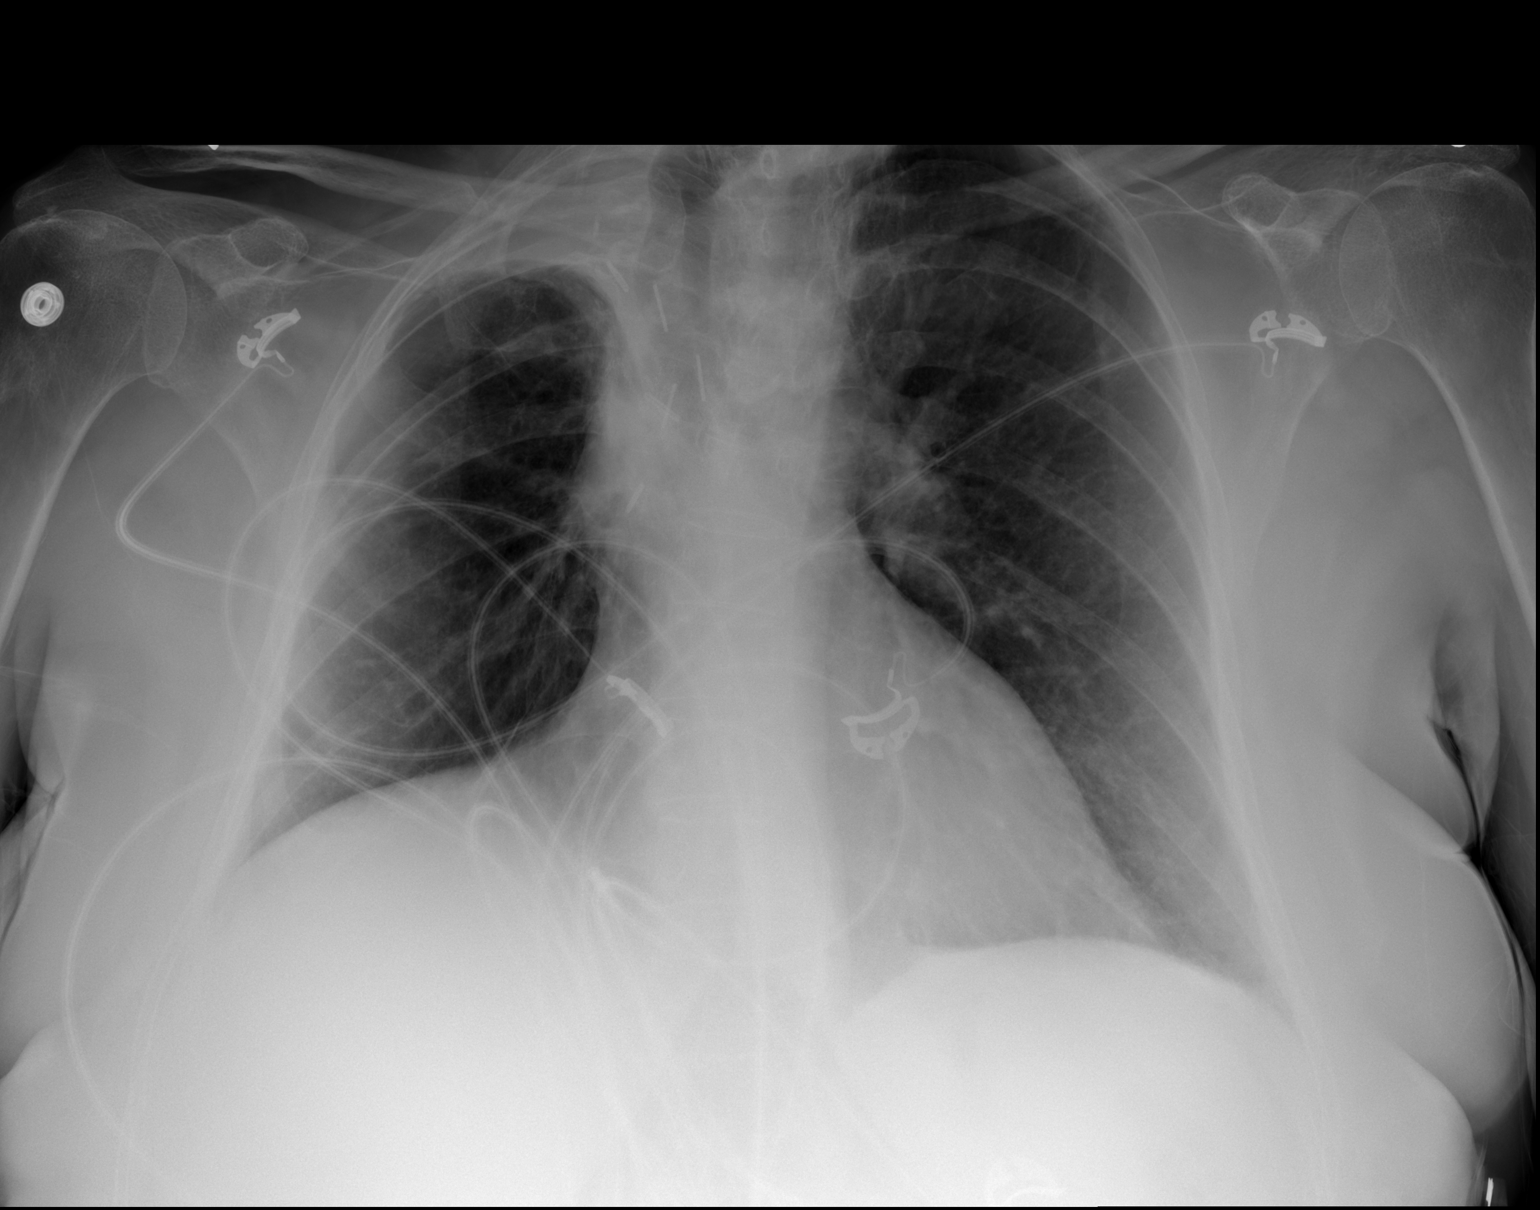

[2 of 2 positions shown; findings below may reference images not displayed]

FINDINGS: The heart remains normal in size.  Stable right apical
pleural thickening and surgical clips and staples.  Stable minimal
diffuse peribronchial thickening and accentuation of the
interstitial markings.  The left lung remains mildly hyperexpanded.
Mild scoliosis.
IMPRESSION: No acute abnormality.  Stable right postoperative changes and mild
changes of COPD.

## 2014-11-23 DEATH — deceased

## 2014-12-23 DEATH — deceased

## 2015-06-20 IMAGING — CR DG ABDOMEN ACUTE W/ 1V CHEST
4 series · 4 of 4 positions shown · non-contrast
Comparison: 12/26/2013 chest x-ray and chest CT.

CLINICAL DATA: Constipation. Abdominal pain. Productive cough.
History of lung cancer.

EXAM:
ACUTE ABDOMEN SERIES (ABDOMEN 2 VIEW & CHEST 1 VIEW)

[w abdomen decub]
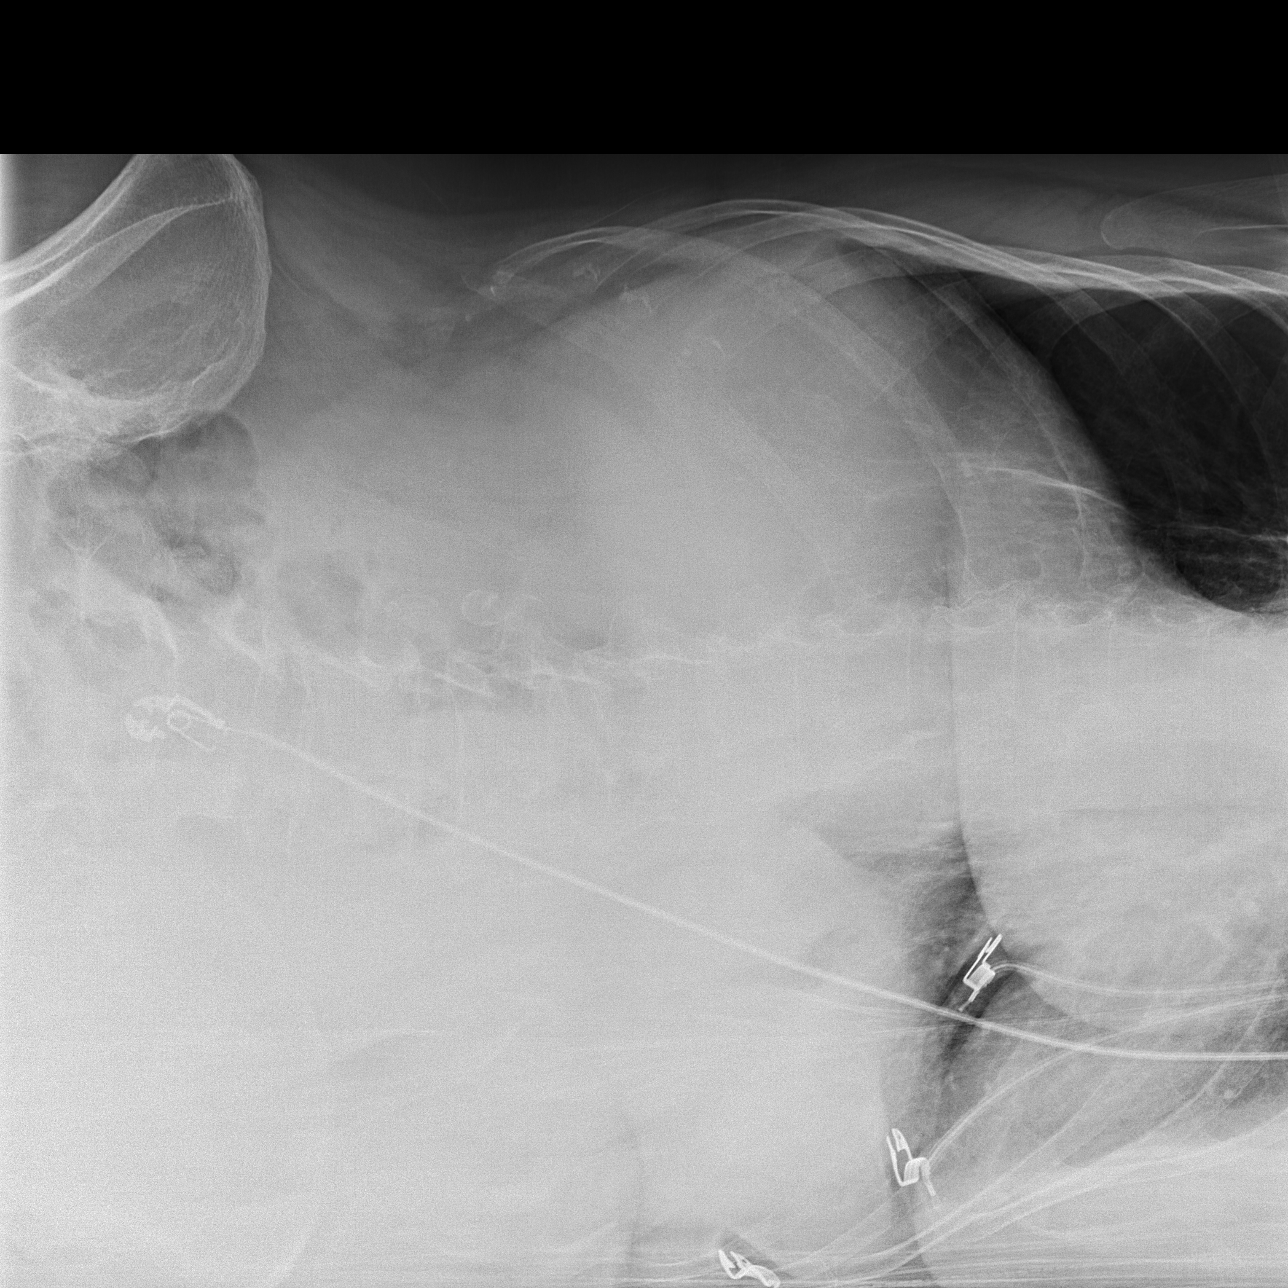

[x abdomen supine (1 of 2)]
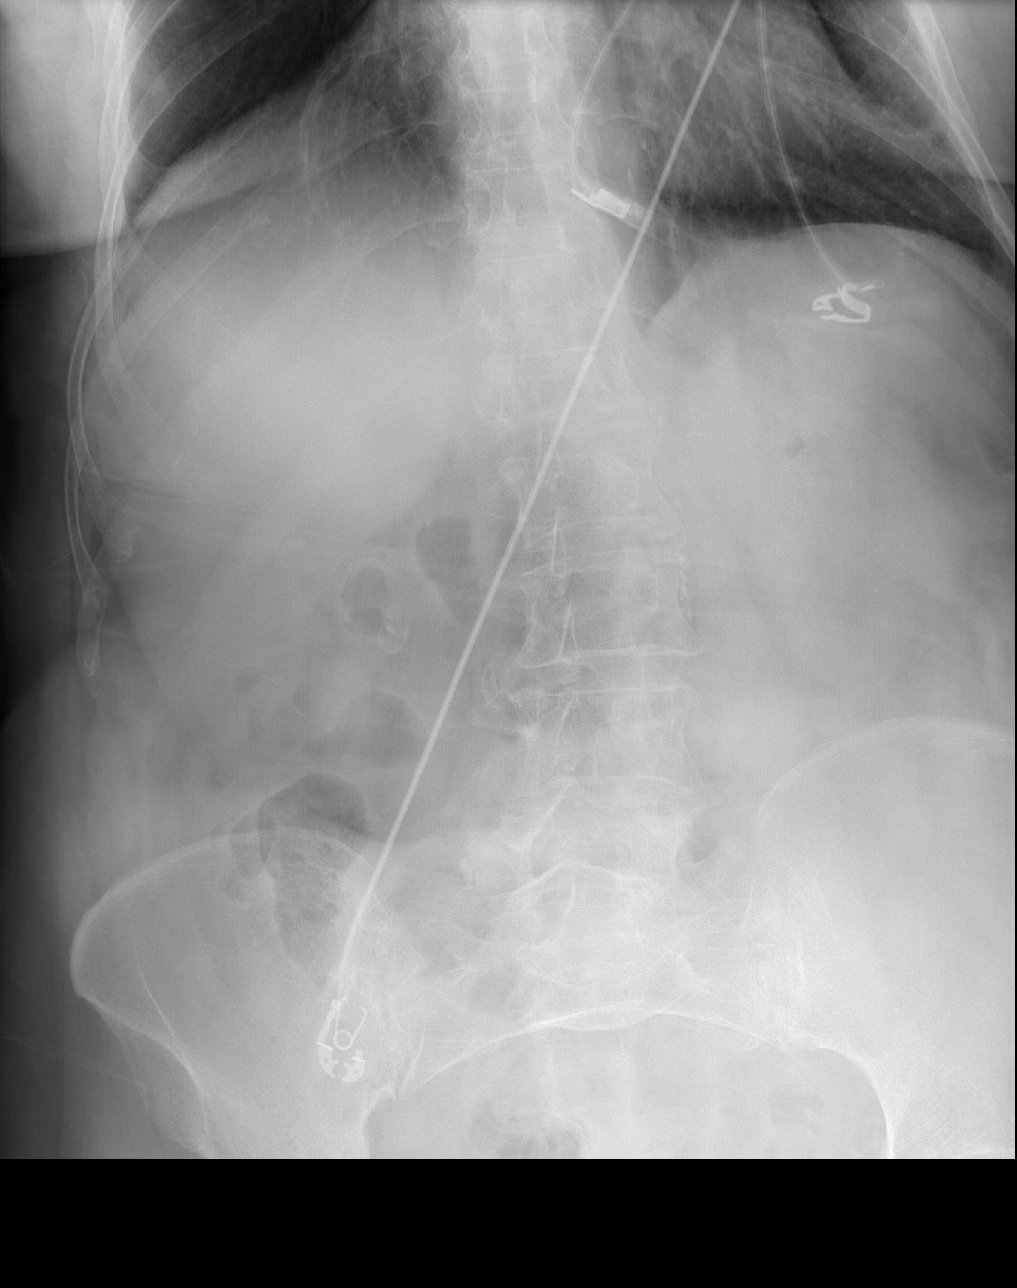

[x abdomen supine (2 of 2)]
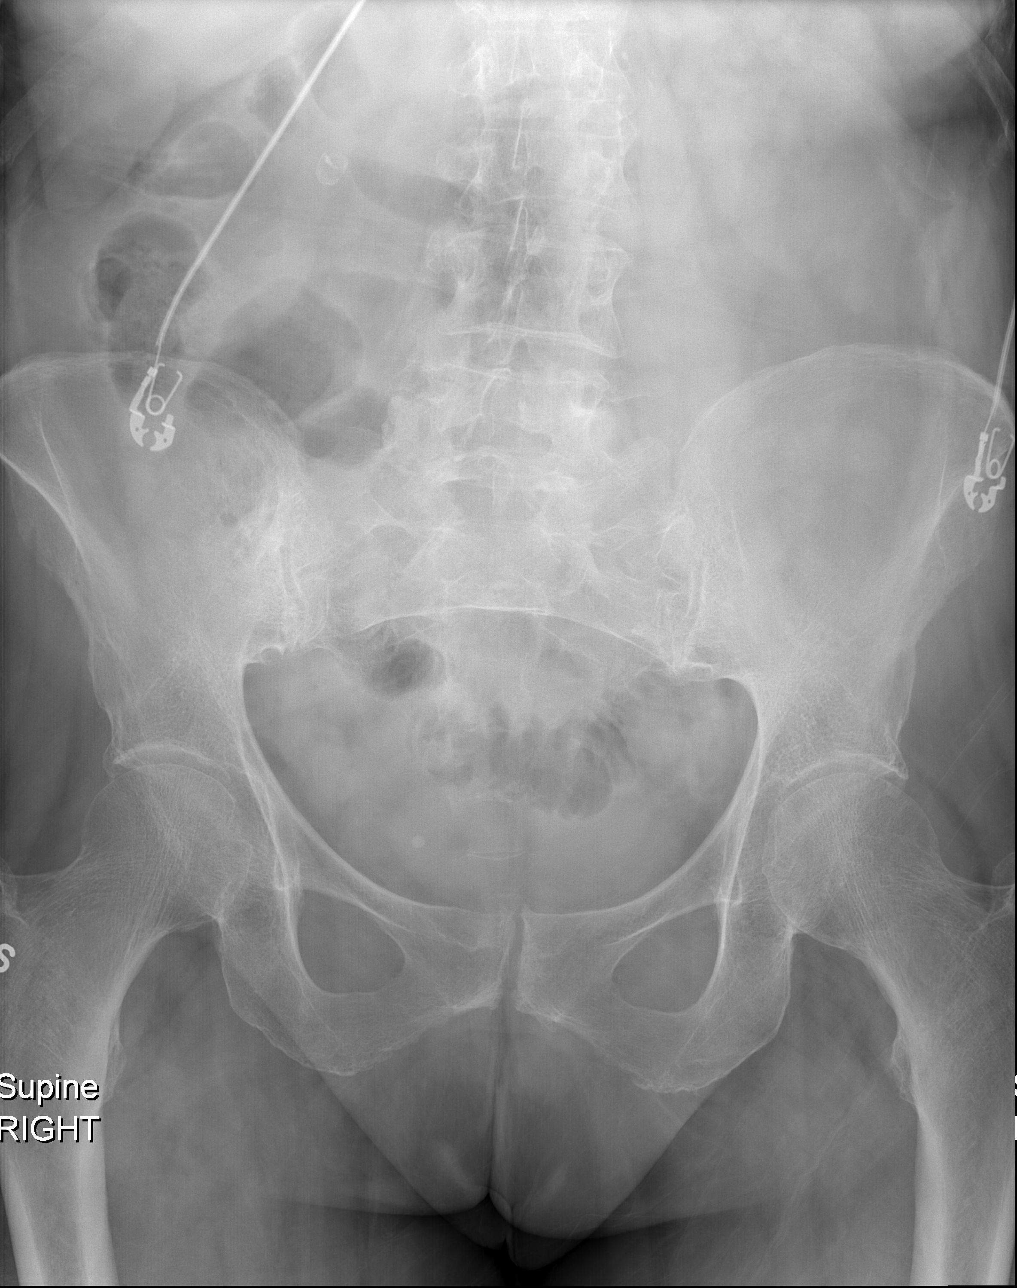

[x chest ap]
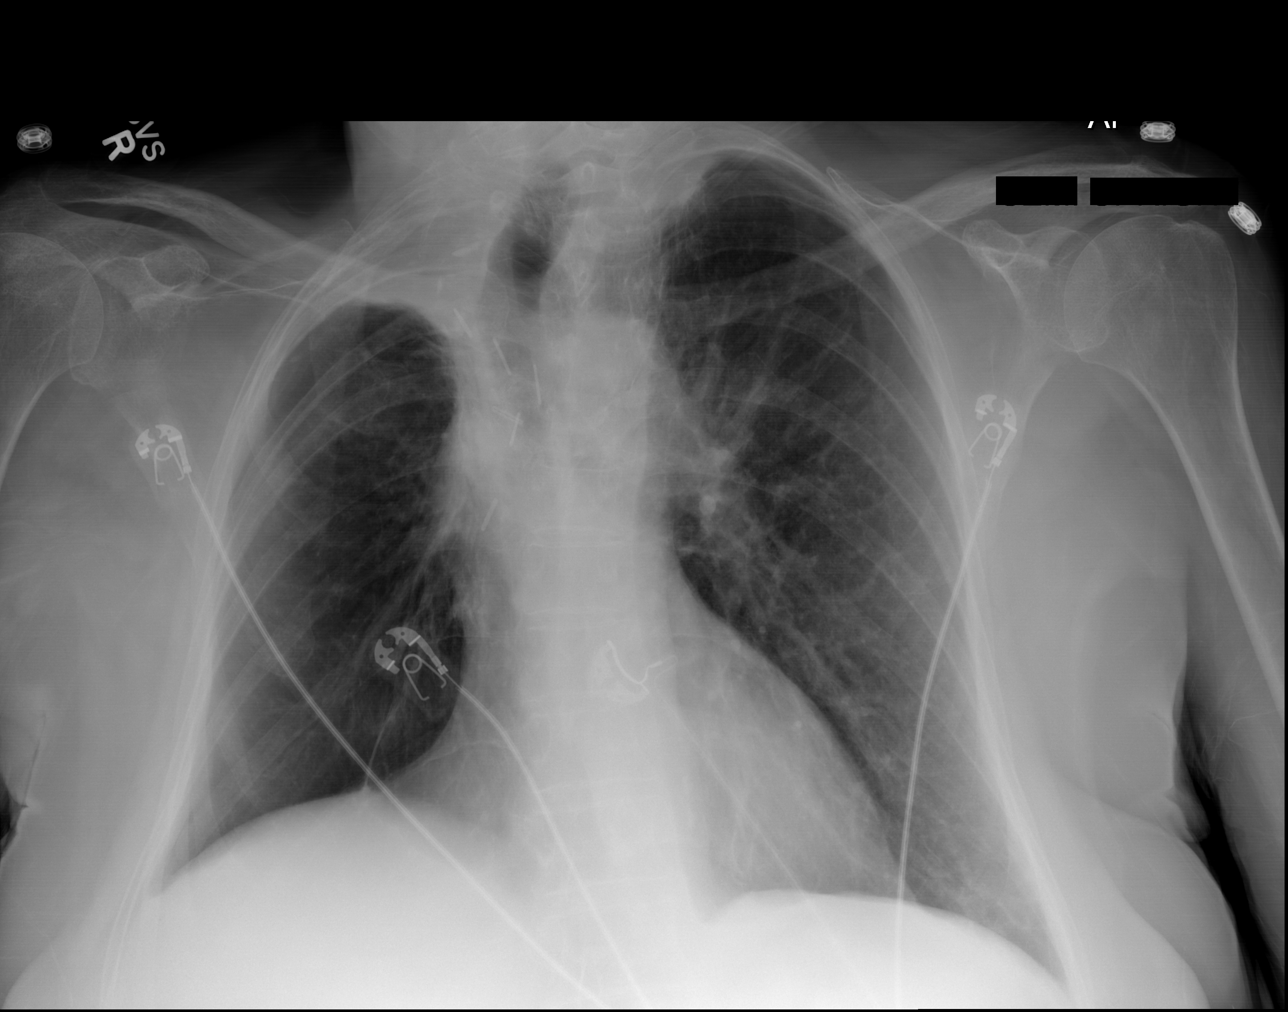

[4 of 4 positions shown; findings below may reference images not displayed]

FINDINGS: Similar appearance of post treatment changes involving the lungs
greater right lung apex with loss of volume and retraction of the
trachea towards the right

No segmental consolidation, gross pneumothorax or pulmonary edema.
Mild central pulmonary vascular prominence.

Heart size top-normal to slightly enlarged but without change.

Carotid artery calcifications.

Nonspecific bowel gas pattern with gas-filled top-normal size small
bowel loops within the pelvis.

No free intraperitoneal air.

Mild scoliosis of the lumbar spine convex the left.

T6 compression fracture noted on CT not appreciated on the present
plain film exam.
IMPRESSION: Nonspecific bowel gas pattern with gas-filled top-normal size small
bowel loops within the pelvis.

Similar appearance of post therapy changes involving the lung as
detailed above.
